# Patient Record
Sex: Female | Born: 1980
Health system: Southern US, Community
[De-identification: ages and names within clinical notes are randomized; demographics above are authoritative.]

## PROBLEM LIST (undated history)

## (undated) DIAGNOSIS — N6019 Diffuse cystic mastopathy of unspecified breast: Secondary | ICD-10-CM

## (undated) DIAGNOSIS — M222X1 Patellofemoral disorders, right knee: Secondary | ICD-10-CM

## (undated) DIAGNOSIS — N809 Endometriosis, unspecified: Secondary | ICD-10-CM

## (undated) DIAGNOSIS — N951 Menopausal and female climacteric states: Secondary | ICD-10-CM

## (undated) DIAGNOSIS — N63 Unspecified lump in unspecified breast: Secondary | ICD-10-CM

## (undated) DIAGNOSIS — D241 Benign neoplasm of right breast: Secondary | ICD-10-CM

## (undated) DIAGNOSIS — Z9889 Other specified postprocedural states: Secondary | ICD-10-CM

## (undated) DIAGNOSIS — J309 Allergic rhinitis, unspecified: Secondary | ICD-10-CM

## (undated) DIAGNOSIS — Z8619 Personal history of other infectious and parasitic diseases: Secondary | ICD-10-CM

## (undated) DIAGNOSIS — K219 Gastro-esophageal reflux disease without esophagitis: Secondary | ICD-10-CM

## (undated) DIAGNOSIS — R002 Palpitations: Secondary | ICD-10-CM

## (undated) HISTORY — DX: Diffuse cystic mastopathy of unspecified breast: N60.19

## (undated) HISTORY — DX: Benign neoplasm of right breast: D24.1

## (undated) HISTORY — DX: Allergic rhinitis, unspecified: J30.9

## (undated) HISTORY — DX: Personal history of other infectious and parasitic diseases: Z86.19

## (undated) HISTORY — DX: Endometriosis, unspecified: N80.9

---
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
## 2008-05-26 ENCOUNTER — Ambulatory Visit: Payer: Self-pay | Admitting: Obstetrics and Gynecology

## 2008-09-15 ENCOUNTER — Observation Stay: Payer: Self-pay | Admitting: Obstetrics and Gynecology

## 2008-09-16 ENCOUNTER — Ambulatory Visit: Payer: Self-pay

## 2008-11-02 ENCOUNTER — Inpatient Hospital Stay: Payer: Self-pay

## 2011-02-11 ENCOUNTER — Inpatient Hospital Stay: Payer: Self-pay

## 2013-05-25 ENCOUNTER — Encounter: Payer: Self-pay | Admitting: Family Medicine

## 2013-05-25 ENCOUNTER — Ambulatory Visit (INDEPENDENT_AMBULATORY_CARE_PROVIDER_SITE_OTHER): Payer: BC Managed Care – PPO | Admitting: Family Medicine

## 2013-05-25 VITALS — BP 110/72 | HR 72 | Temp 98.6°F | Ht 67.0 in | Wt 139.8 lb

## 2013-05-25 DIAGNOSIS — N390 Urinary tract infection, site not specified: Secondary | ICD-10-CM | POA: Insufficient documentation

## 2013-05-25 DIAGNOSIS — N809 Endometriosis, unspecified: Secondary | ICD-10-CM

## 2013-05-25 DIAGNOSIS — R5381 Other malaise: Secondary | ICD-10-CM

## 2013-05-25 DIAGNOSIS — R5383 Other fatigue: Secondary | ICD-10-CM

## 2013-05-25 DIAGNOSIS — J309 Allergic rhinitis, unspecified: Secondary | ICD-10-CM

## 2013-05-25 DIAGNOSIS — Z23 Encounter for immunization: Secondary | ICD-10-CM

## 2013-05-25 LAB — CBC WITH DIFFERENTIAL/PLATELET
Basophils Absolute: 0 10*3/uL (ref 0.0–0.1)
Basophils Relative: 0.6 % (ref 0.0–3.0)
Eosinophils Absolute: 0.2 10*3/uL (ref 0.0–0.7)
HCT: 40.1 % (ref 36.0–46.0)
Hemoglobin: 13.7 g/dL (ref 12.0–15.0)
Lymphs Abs: 2.7 10*3/uL (ref 0.7–4.0)
MCHC: 34.1 g/dL (ref 30.0–36.0)
MCV: 86.3 fl (ref 78.0–100.0)
Monocytes Relative: 5.4 % (ref 3.0–12.0)
Neutro Abs: 3.2 10*3/uL (ref 1.4–7.7)
Platelets: 264 10*3/uL (ref 150.0–400.0)
RBC: 4.65 Mil/uL (ref 3.87–5.11)
RDW: 12.8 % (ref 11.5–14.6)
WBC: 6.4 10*3/uL (ref 4.5–10.5)

## 2013-05-25 NOTE — Patient Instructions (Signed)
Let's check blood work today - blood count. Good to meet you today, call us with questions. Tdap today. Return as needed or in next few years for a physical.

## 2013-05-25 NOTE — Assessment & Plan Note (Signed)
Recent infection - treated with 3d course cipro which led to GI upset. Mild persistent sxs of bladder pressure, but overall improved. Advised continue to monitor - and will notify me if has persistent sxs.  she states she is expecting a f/u call from GYN and will check with them regarding sensitivity of UCx to cipro.

## 2013-05-25 NOTE — Addendum Note (Signed)
Addended by: Josph Macho A on: 05/25/2013 11:47 AM   Modules accepted: Orders

## 2013-05-25 NOTE — Progress Notes (Signed)
Subjective:    Patient ID: Marie Lester, female    DOB: [birth year], 32 y.o.   MRN: [id number]  HPI CC: new pt to establish  [name] presents today to establish care .  She moved here about 6 yrs ago from [place].  She is married to Cruz Condon a Hydrologist.  Recent UTI with hematuria and frequency and urgency and suprapubic and periumbilical discomfort - seen at [facility] and treatdd with cipro 3d course.  Finished med on Sunday.  Treated with cipro and pyridium - this caused GI distress after food with nausea.  Total of 3d course.  Some persistent urine symptoms of pressure.  Some chills and night sweats.  No back pain.  Allergic rhinitis - waking up at night with allergy sxs - was on singulair for this which helped.  Recently had allergy skin testing - allergic to dustmites and ragweed.  Has stopped med because sxs improved.  Also took zyrtec for this.  Also has used nasonex prn.  Sees Dr. Andee Poles for this [facility]).  Endometriosis stage 3 - followed by OBGYN.  Had surgery at age 15 yo.  May need rpt surgery.  Did have recent prolonged bleed with new birth control, that has resolved now.  Has felt weak and tired, but no dizziness, chest pain, palpitations.  She recently had normal thyroid function.  H/o neuralgia of right side of face last year but now better.  Preventative: Last year normal blood work (chol, sugar) per patient. Dr. Vear Clock OBGYN last well woman was about 2-3 months ago. Flu shot - declines today. Tetanus - >10 yrs ago.  Would like today.  Lives with husband and 4 children, 1 dog Occupation: stay at home mom Edu: AA Activity: runs regularly, works out Diet: good water, fruits/vegetables daily, fish  Medications and allergies reviewed and updated in chart.  Past histories reviewed and updated if relevant as below. There are no active problems to display for this patient.  Past Medical History  Diagnosis Date  . History of chicken pox   .  Endometriosis 2001    stage 3 s/p surgery at age 74 yo  . Fibroadenoma of right breast in female 2000    removed  . Allergic rhinitis   . Fibrocystic breast    Past Surgical History  Procedure Laterality Date  . Laparoscopy  2001    for endometriosis  . Tumor removal Right 2000    fibroadenoma-right breast   History  Substance Use Topics  . Smoking status: Never Smoker   . Smokeless tobacco: Never Used  . Alcohol Use: No   Family History  Problem Relation Age of Onset  . Alcohol abuse Maternal Grandfather     and grandmother  . CAD Neg Hx   . Stroke Neg Hx   . Diabetes Neg Hx   . Cancer Neg Hx    Allergies  Allergen Reactions  . Augmentin [Amoxicillin-Pot Clavulanate] Other (See Comments)    "feels really bad on it"  . Ciprofloxacin Nausea Only    Gi upset  . Codeine Nausea And Vomiting   No current outpatient prescriptions on file prior to visit.   No current facility-administered medications on file prior to visit.     Review of Systems Per HPI    Objective:   Physical Exam  Nursing note and vitals reviewed. Constitutional: She is oriented to person, place, and time. She appears well-developed and well-nourished. No distress.  HENT:  Head: Normocephalic and  atraumatic.  Right Ear: Hearing, tympanic membrane, external ear and ear canal normal.  Left Ear: Hearing, tympanic membrane, external ear and ear canal normal.  Nose: Nose normal.  Mouth/Throat: Oropharynx is clear and moist. No oropharyngeal exudate.  Eyes: Conjunctivae and EOM are normal. Pupils are equal, round, and reactive to light. No scleral icterus.  Neck: Normal range of motion. Neck supple. No thyromegaly present.  Cardiovascular: Normal rate, regular rhythm, normal heart sounds and intact distal pulses.   No murmur heard. Pulses:      Radial pulses are 2+ on the right side, and 2+ on the left side.  Slight 1/6 flow murmur  Pulmonary/Chest: Effort normal and breath sounds normal. No  respiratory distress. She has no wheezes. She has no rales.  Abdominal: Soft. Bowel sounds are normal. She exhibits no distension and no mass. There is no tenderness. There is no rebound and no guarding.  Musculoskeletal: Normal range of motion. She exhibits no edema.  Lymphadenopathy:    She has no cervical adenopathy.  Neurological: She is alert and oriented to person, place, and time.  CN grossly intact, station and gait intact  Skin: Skin is warm and dry. No rash noted. No pallor.  Psychiatric: She has a normal mood and affect. Her behavior is normal. Judgment and thought content normal.       Assessment & Plan:

## 2013-05-25 NOTE — Progress Notes (Signed)
Pre-visit discussion using our clinic review tool. No additional management support is needed unless otherwise documented below in the visit note.  

## 2013-05-25 NOTE — Assessment & Plan Note (Signed)
Will continue to follow with GYN

## 2013-05-25 NOTE — Assessment & Plan Note (Signed)
She has not been resting as well recently, and does care for 4 children at home.  Will check CBC in setting of recent endorsed menorrhagia to ensure not another cause for fatigue.  Pt states recent normal thyroid level.

## 2013-05-25 NOTE — Assessment & Plan Note (Signed)
Currently stable.

## 2013-06-21 ENCOUNTER — Encounter: Payer: Self-pay | Admitting: Internal Medicine

## 2013-06-21 ENCOUNTER — Ambulatory Visit (INDEPENDENT_AMBULATORY_CARE_PROVIDER_SITE_OTHER): Payer: BC Managed Care – PPO | Admitting: Internal Medicine

## 2013-06-21 ENCOUNTER — Telehealth: Payer: Self-pay

## 2013-06-21 VITALS — BP 106/68 | HR 67 | Temp 97.7°F | Wt 141.8 lb

## 2013-06-21 DIAGNOSIS — J019 Acute sinusitis, unspecified: Secondary | ICD-10-CM

## 2013-06-21 MED ORDER — CEFUROXIME AXETIL 500 MG PO TABS: 500.0000 mg | ORAL_TABLET | Freq: Two times a day (BID) | ORAL | Status: DC

## 2013-06-21 MED ORDER — FLUTICASONE PROPIONATE 50 MCG/ACT NA SUSP: 2.0000 | Freq: Every day | NASAL | Status: DC

## 2013-06-21 NOTE — Patient Instructions (Signed)

## 2013-06-21 NOTE — Telephone Encounter (Signed)
Pt request less expensive antibiotic; cost to pt for ceftin is $69.00. Pt said she is allergic to Augmentin and will be OK if antibiotic is not sent in today. Pt request cb when new antibiotic sent to [facility].

## 2013-06-21 NOTE — Progress Notes (Signed)
Pre-visit discussion using our clinic review tool. No additional management support is needed unless otherwise documented below in the visit note.  

## 2013-06-21 NOTE — Progress Notes (Signed)
HPI  Pt presents to the clinic today with c/o cough and facial pain. This started about 1 week ago. The cough is productive of thick yellow mucous. She is blowing thick green "chunky sputum" out of her nose. She has taken Tylenol cold and sinus ibuprofen, mucinex and Nyquil without much relief. She does have a history of allergies. She does not take an anthihistamine OTC daily. She has not had sick contacts that she is aware of.  Review of Systems      Past Medical History  Diagnosis Date  . History of chicken pox   . Endometriosis 2001    stage 3 s/p surgery at age 68 yo  . Fibroadenoma of right breast in female 2000    removed  . Allergic rhinitis     dustmite, ragweed  . Fibrocystic breast     Family History  Problem Relation Age of Onset  . Alcohol abuse Maternal Grandfather     and grandmother  . CAD Neg Hx   . Stroke Neg Hx   . Diabetes Neg Hx   . Cancer Neg Hx     History   Social History  . Marital Status: Married    Spouse Name: N/A    Number of Children: N/A  . Years of Education: N/A   Occupational History  . Not on file.   Social History Main Topics  . Smoking status: Never Smoker   . Smokeless tobacco: Never Used  . Alcohol Use: No  . Drug Use: No  . Sexual Activity: Not on file   Other Topics Concern  . Not on file   Social History Narrative   Lives with husband and 4 children, 1 dog   Occupation: stay at home mom   Edu: AA   Activity: runs regularly, works out   Diet: good water, fruits/vegetables daily, fish    Allergies  Allergen Reactions  . Augmentin [Amoxicillin-Pot Clavulanate] Other (See Comments)    "feels really bad on it"  . Ciprofloxacin Nausea Only    Gi upset  . Codeine Nausea And Vomiting     Constitutional: Positive headache, fatigue and fever. Denies abrupt weight changes.  HEENT:  Positive nasal congestion and sore throat. Denies eye redness, eye pain, pressure behind the eyes, facial pain, ear pain, ringing in the  ears, wax buildup, runny nose or bloody nose. Respiratory: Positive cough. Denies difficulty breathing or shortness of breath.  Cardiovascular: Denies chest pain, chest tightness, palpitations or swelling in the hands or feet.   No other specific complaints in a complete review of systems (except as listed in HPI above).  Objective:   BP 106/68  Pulse 67  Temp(Src) 97.7 F (36.5 C) (Oral)  Wt 141 lb 12.8 oz (64.32 kg)  SpO2 98%  LMP 05/15/2013 Wt Readings from Last 3 Encounters:  06/21/13 141 lb 12.8 oz (64.32 kg)  05/25/13 139 lb 12 oz (63.39 kg)     General: Appears her stated age, well developed, well nourished in NAD. HEENT: Head: normal shape and size, sinus tenderness noted; Eyes: sclera white, no icterus, conjunctiva pink, PERRLA and EOMs intact; Ears: Tm's gray and intact, normal light reflex; Nose: mucosa pink and moist, septum midline; Throat/Mouth: + PND. Teeth present, mucosa erythematous and moist, no exudate noted, no lesions or ulcerations noted.  Neck: Mild cervical lymphadenopathy. Neck supple, trachea midline. No massses, lumps or thyromegaly present.  Cardiovascular: Normal rate and rhythm. S1,S2 noted.  No murmur, rubs or gallops noted. No JVD  or BLE edema. No carotid bruits noted. Pulmonary/Chest: Normal effort and positive vesicular breath sounds. No respiratory distress. No wheezes, rales or ronchi noted.      Assessment & Plan:   Acute Sinusitis:  Get some rest and drink plenty of water Do salt water gargles for the sore throat eRx for ceftin BID x 10 days and flonase  RTC as needed or if symptoms persist.

## 2013-06-22 ENCOUNTER — Other Ambulatory Visit: Payer: Self-pay | Admitting: Internal Medicine

## 2013-06-22 MED ORDER — CEFDINIR 300 MG PO CAPS: 300.0000 mg | ORAL_CAPSULE | Freq: Two times a day (BID) | ORAL | Status: DC

## 2013-06-22 NOTE — Telephone Encounter (Signed)
Sent in omnicef

## 2013-06-22 NOTE — Telephone Encounter (Signed)
Left detailed message letting pt know the new Rx was sent to pharmacy

## 2013-08-02 ENCOUNTER — Ambulatory Visit: Payer: BC Managed Care – PPO | Admitting: Family Medicine

## 2013-08-05 ENCOUNTER — Ambulatory Visit: Payer: BC Managed Care – PPO | Admitting: Family Medicine

## 2013-08-09 ENCOUNTER — Ambulatory Visit: Payer: BC Managed Care – PPO | Admitting: Family Medicine

## 2013-08-12 ENCOUNTER — Encounter: Payer: Self-pay | Admitting: Family Medicine

## 2013-08-12 ENCOUNTER — Ambulatory Visit (INDEPENDENT_AMBULATORY_CARE_PROVIDER_SITE_OTHER): Payer: BC Managed Care – PPO | Admitting: Family Medicine

## 2013-08-12 VITALS — BP 108/74 | HR 69 | Temp 98.7°F | Ht 67.0 in | Wt 139.5 lb

## 2013-08-12 DIAGNOSIS — K2990 Gastroduodenitis, unspecified, without bleeding: Secondary | ICD-10-CM

## 2013-08-12 DIAGNOSIS — K2970 Gastritis, unspecified, without bleeding: Secondary | ICD-10-CM | POA: Insufficient documentation

## 2013-08-12 DIAGNOSIS — K219 Gastro-esophageal reflux disease without esophagitis: Secondary | ICD-10-CM | POA: Insufficient documentation

## 2013-08-12 NOTE — Progress Notes (Signed)
   Subjective:    Patient ID: Marie Lester, female    DOB: [birth year], 33 y.o.   MRN: [id number]  HPI  33 year old female pt of Dr. Leo Grosser presents with new onset in of diarrhea. Started 3 weeks ago diarrhea at night, resolved after 3 days. After this she started with epigastric pain, belching, GERD, reflux.  Occ wheeze with breathing in at night.  She had recently started on prilosec 40 mg x 2 weeks, zantac as well as recommended from her previous MD, friend of family.  No fever.  She compeleted prilosec yesterday. She has had less burping, still nausea. Some occ intermittant pain behind umbilicus.   She also started probiotic. BMs more regular now.  Review of Systems  Constitutional: Negative for fever and fatigue.  HENT: Negative for ear pain.   Eyes: Negative for pain.  Respiratory: Negative for chest tightness and shortness of breath.   Cardiovascular: Negative for chest pain, palpitations and leg swelling.  Gastrointestinal: Positive for abdominal pain.  Genitourinary: Negative for dysuria.       Objective:   Physical Exam  Constitutional: Vital signs are normal. She appears well-developed and well-nourished. She is cooperative.  Non-toxic appearance. She does not appear ill. No distress.  HENT:  Head: Normocephalic.  Right Ear: Hearing, tympanic membrane, external ear and ear canal normal. Tympanic membrane is not erythematous, not retracted and not bulging.  Left Ear: Hearing, tympanic membrane, external ear and ear canal normal. Tympanic membrane is not erythematous, not retracted and not bulging.  Nose: No mucosal edema or rhinorrhea. Right sinus exhibits no maxillary sinus tenderness and no frontal sinus tenderness. Left sinus exhibits no maxillary sinus tenderness and no frontal sinus tenderness.  Mouth/Throat: Uvula is midline, oropharynx is clear and moist and mucous membranes are normal.  Eyes: Conjunctivae, EOM and lids are normal. Pupils are equal, round, and  reactive to light. Lids are everted and swept, no foreign bodies found.  Neck: Trachea normal and normal range of motion. Neck supple. Carotid bruit is not present. No mass and no thyromegaly present.  Cardiovascular: Normal rate, regular rhythm, S1 normal, S2 normal, normal heart sounds, intact distal pulses and normal pulses.  Exam reveals no gallop and no friction rub.   No murmur heard. Pulmonary/Chest: Effort normal and breath sounds normal. Not tachypneic. No respiratory distress. She has no decreased breath sounds. She has no wheezes. She has no rhonchi. She has no rales.  Abdominal: Soft. Normal appearance and bowel sounds are normal. There is no hepatosplenomegaly. There is tenderness in the periumbilical area. There is no rigidity, no rebound, no guarding and no CVA tenderness.  She does have separation of abdominal wall muscles from pregnancies but no palpated hernia  Neurological: She is alert.  Skin: Skin is warm, dry and intact. No rash noted.  Psychiatric: Her speech is normal and behavior is normal. Judgment and thought content normal. Her mood appears not anxious. Cognition and memory are normal. She does not exhibit a depressed mood.          Assessment & Plan:  GERD, gastritis. No sign of hernia, appendicitis or gallbaldder issue.  PUD possible Treat with change to nexium for 4 more week. If not continuing to improve after 2 weeks, call for GI referral  Once done course wean off.  Avoid triggers.

## 2013-08-12 NOTE — Progress Notes (Signed)
Pre visit review using our clinic review tool, if applicable. No additional management support is needed unless otherwise documented below in the visit note. 

## 2013-08-12 NOTE — Patient Instructions (Signed)
Stop prilosec. Start nexium 40 mg daily for 4-6 weeks. If not improving further in 2 week , call for GI referral. Continue to avoid citris, tomatos, caffeine, alcohol, soda, chocolate, spicy. Can continue probiotic if you like.

## 2013-08-15 ENCOUNTER — Ambulatory Visit: Payer: BC Managed Care – PPO | Admitting: Family Medicine

## 2014-04-06 ENCOUNTER — Encounter: Payer: Self-pay | Admitting: Internal Medicine

## 2014-04-06 ENCOUNTER — Ambulatory Visit (INDEPENDENT_AMBULATORY_CARE_PROVIDER_SITE_OTHER): Payer: BC Managed Care – PPO | Admitting: Internal Medicine

## 2014-04-06 VITALS — BP 104/72 | HR 61 | Temp 98.1°F | Wt 143.5 lb

## 2014-04-06 DIAGNOSIS — L255 Unspecified contact dermatitis due to plants, except food: Secondary | ICD-10-CM

## 2014-04-06 MED ORDER — METHYLPREDNISOLONE ACETATE 80 MG/ML IJ SUSP
80.0000 mg | Freq: Once | INTRAMUSCULAR | Status: AC
  Administered 2014-04-06: 80 mg via INTRAMUSCULAR

## 2014-04-06 NOTE — Progress Notes (Signed)
Pre visit review using our clinic review tool, if applicable. No additional management support is needed unless otherwise documented below in the visit note. 

## 2014-04-06 NOTE — Patient Instructions (Addendum)

## 2014-04-07 NOTE — Progress Notes (Signed)
Subjective:    Patient ID: Marie Lester, female    DOB: 1981/05/14, 33 y.o.   MRN: [id number]  HPI  Pt presents to the clinic today with c/o a rash on her neck and arms. She noticed this 3 days ago after pulling weeds out in her yard. The rash is very itchy and burning. She has put mometasone cream on it without any relief. Her kid also has poison ivy and is on steroids. She has not changed lotion, soaps or detergents.  Review of Systems      Past Medical History  Diagnosis Date  . History of chicken pox   . Endometriosis 2001    stage 3 s/p surgery at age 31 yo  . Fibroadenoma of right breast in female 2000    removed  . Allergic rhinitis     dustmite, ragweed  . Fibrocystic breast     Current Outpatient Prescriptions  Medication Sig Dispense Refill  . b complex vitamins tablet Take 1 tablet by mouth daily.      . Cholecalciferol (VITAMIN D-3) 5000 UNITS TABS Take 1 tablet by mouth daily.       No current facility-administered medications for this visit.    Allergies  Allergen Reactions  . Augmentin [Amoxicillin-Pot Clavulanate] Other (See Comments)    "feels really bad on it"  . Ciprofloxacin Nausea Only    Gi upset  . Codeine Nausea And Vomiting    Family History  Problem Relation Age of Onset  . Alcohol abuse Maternal Grandfather     and grandmother  . CAD Neg Hx   . Stroke Neg Hx   . Diabetes Neg Hx   . Cancer Neg Hx     History   Social History  . Marital Status: Married    Spouse Name: N/A    Number of Children: N/A  . Years of Education: N/A   Occupational History  . Not on file.   Social History Main Topics  . Smoking status: Never Smoker   . Smokeless tobacco: Never Used  . Alcohol Use: No  . Drug Use: No  . Sexual Activity: Not on file   Other Topics Concern  . Not on file   Social History Narrative   Lives with husband and 4 children, 1 dog   Occupation: stay at home mom   Edu: AA   Activity: runs regularly, works out   Diet:  good water, fruits/vegetables daily, fish     Constitutional: Denies fever, malaise, fatigue, headache or abrupt weight changes.  Skin: Pt reports rash. Denies ulcercations.    No other specific complaints in a complete review of systems (except as listed in HPI above).  Objective:   Physical Exam  BP 104/72  Pulse 61  Temp(Src) 98.1 F (36.7 C) (Oral)  Wt 143 lb 8 oz (65.091 kg)  SpO2 99% Wt Readings from Last 3 Encounters:  04/06/14 143 lb 8 oz (65.091 kg)  08/12/13 139 lb 8 oz (63.277 kg)  06/21/13 141 lb 12.8 oz (64.32 kg)    General: Appears her stated age, well developed, well nourished in NAD. Skin: Vesicular rash on erythematous base noted on bilateral arms and neck. Excoriation noted from scratching. Cardiovascular: Normal rate and rhythm. S1,S2 noted.  No murmur, rubs or gallops noted.  Pulmonary/Chest: Normal effort and positive vesicular breath sounds. No respiratory distress. No wheezes, rales or ronchi noted.   CBC    Component Value Date/Time   WBC 6.4 05/25/2013 1125  RBC 4.65 05/25/2013 1125   HGB 13.7 05/25/2013 1125   HCT 40.1 05/25/2013 1125   PLT 264.0 05/25/2013 1125   MCV 86.3 05/25/2013 1125   MCHC 34.1 05/25/2013 1125   RDW 12.8 05/25/2013 1125   LYMPHSABS 2.7 05/25/2013 1125   MONOABS 0.3 05/25/2013 1125   EOSABS 0.2 05/25/2013 1125   BASOSABS 0.0 05/25/2013 1125    Hgb A1C No results found for this basename: HGBA1C         Assessment & Plan:   Contact dermatitis due to plant:  80 mg Depo IM today Benadryl as needed for itching  RTC as needed or if symptoms persist or worsen

## 2014-04-10 ENCOUNTER — Encounter: Payer: Self-pay | Admitting: Internal Medicine

## 2014-04-10 ENCOUNTER — Ambulatory Visit (INDEPENDENT_AMBULATORY_CARE_PROVIDER_SITE_OTHER): Payer: BC Managed Care – PPO | Admitting: Internal Medicine

## 2014-04-10 VITALS — BP 112/68 | HR 58 | Temp 98.8°F | Wt 143.0 lb

## 2014-04-10 DIAGNOSIS — L259 Unspecified contact dermatitis, unspecified cause: Secondary | ICD-10-CM

## 2014-04-10 DIAGNOSIS — L237 Allergic contact dermatitis due to plants, except food: Secondary | ICD-10-CM

## 2014-04-10 MED ORDER — TRIAMCINOLONE ACETONIDE 0.1 % EX CREA: 1.0000 "application " | TOPICAL_CREAM | Freq: Two times a day (BID) | CUTANEOUS | Status: DC

## 2014-04-10 MED ORDER — PREDNISONE 10 MG PO TABS: ORAL_TABLET | ORAL | Status: DC

## 2014-04-10 MED ORDER — DEXAMETHASONE SODIUM PHOSPHATE 10 MG/ML IJ SOLN
10.0000 mg | Freq: Once | INTRAMUSCULAR | Status: AC
  Administered 2014-04-10: 10 mg via INTRAMUSCULAR

## 2014-04-10 NOTE — Progress Notes (Signed)
Subjective:    Patient ID: Marie Lester, female    DOB: 1980-07-06, 33 y.o.   MRN: [id number]  HPI  Pt presents to the clinic today to follow up poison ivy. She was seen 10/29 for the same. She was given 80 mg Depo IM. She reports that the shot helped with the itching but the rash has spread and gotten worse. She reports that she is highly allergic to poison ivy. She also tried some mometasone cream without much relief.  Review of Systems      Past Medical History  Diagnosis Date  . History of chicken pox   . Endometriosis 2001    stage 3 s/p surgery at age 62 yo  . Fibroadenoma of right breast in female 2000    removed  . Allergic rhinitis     dustmite, ragweed  . Fibrocystic breast     Current Outpatient Prescriptions  Medication Sig Dispense Refill  . b complex vitamins tablet Take 1 tablet by mouth daily.    . Cholecalciferol (VITAMIN D-3) 5000 UNITS TABS Take 1 tablet by mouth daily.    Marland Kitchen triamcinolone cream (KENALOG) 0.1 % Apply 1 application topically 2 (two) times daily. 30 g 0   No current facility-administered medications for this visit.    Allergies  Allergen Reactions  . Augmentin [Amoxicillin-Pot Clavulanate] Other (See Comments)    "feels really bad on it"  . Ciprofloxacin Nausea Only    Gi upset  . Codeine Nausea And Vomiting    Family History  Problem Relation Age of Onset  . Alcohol abuse Maternal Grandfather     and grandmother  . CAD Neg Hx   . Stroke Neg Hx   . Diabetes Neg Hx   . Cancer Neg Hx     History   Social History  . Marital Status: Married    Spouse Name: N/A    Number of Children: N/A  . Years of Education: N/A   Occupational History  . Not on file.   Social History Main Topics  . Smoking status: Never Smoker   . Smokeless tobacco: Never Used  . Alcohol Use: No  . Drug Use: No  . Sexual Activity: Not on file   Other Topics Concern  . Not on file   Social History Narrative   Lives with husband and 4 children, 1  dog   Occupation: stay at home mom   Edu: AA   Activity: runs regularly, works out   Diet: good water, fruits/vegetables daily, fish     Constitutional: Denies fever, malaise, fatigue, headache or abrupt weight changes.  Respiratory: Denies difficulty breathing, shortness of breath, cough or sputum production.   Cardiovascular: Denies chest pain, chest tightness, palpitations or swelling in the hands or feet.  Skin: Pt reports rash. Denies ulcercations.     No other specific complaints in a complete review of systems (except as listed in HPI above).  Objective:   Physical Exam  BP 112/68 mmHg  Pulse 58  Temp(Src) 98.8 F (37.1 C) (Oral)  Wt 143 lb (64.864 kg)  SpO2 98% Wt Readings from Last 3 Encounters:  04/10/14 143 lb (64.864 kg)  04/06/14 143 lb 8 oz (65.091 kg)  08/12/13 139 lb 8 oz (63.277 kg)    General: Appears her stated age, well developed, well nourished in NAD. Skin: Scattered vesicular lesions in linear pattern. Some lesions scabbed over. Excoriation noted. Cardiovascular: Normal rate and rhythm. S1,S2 noted.  No murmur, rubs or gallops noted.  No JVD or BLE edema. No carotid bruits noted. Pulmonary/Chest: Normal effort and positive vesicular breath sounds. No respiratory distress. No wheezes, rales or ronchi noted.   BMET No results found for: NA, K, CL, CO2, GLUCOSE, BUN, CREATININE, CALCIUM, GFRNONAA, GFRAA  Lipid Panel  No results found for: CHOL, TRIG, HDL, CHOLHDL, VLDL, LDLCALC  CBC    Component Value Date/Time   WBC 6.4 05/25/2013 1125   RBC 4.65 05/25/2013 1125   HGB 13.7 05/25/2013 1125   HCT 40.1 05/25/2013 1125   PLT 264.0 05/25/2013 1125   MCV 86.3 05/25/2013 1125   MCHC 34.1 05/25/2013 1125   RDW 12.8 05/25/2013 1125   LYMPHSABS 2.7 05/25/2013 1125   MONOABS 0.3 05/25/2013 1125   EOSABS 0.2 05/25/2013 1125   BASOSABS 0.0 05/25/2013 1125    Hgb A1C No results found for: HGBA1C       Assessment & Plan:   Contact dermatitis due  to plant:  Did not resolve with 80 mg Depo She does not want to try oral steroids Will try 10 mg Decadron today eRx for triamcinolone cream  RTC as needed or if symptoms persist

## 2014-04-10 NOTE — Patient Instructions (Signed)

## 2014-04-10 NOTE — Progress Notes (Signed)
Pre visit review using our clinic review tool, if applicable. No additional management support is needed unless otherwise documented below in the visit note. 

## 2015-04-10 HISTORY — PX: AUGMENTATION MAMMAPLASTY: SUR837

## 2015-07-02 ENCOUNTER — Other Ambulatory Visit: Payer: Self-pay | Admitting: Obstetrics and Gynecology

## 2015-07-02 DIAGNOSIS — N63 Unspecified lump in unspecified breast: Secondary | ICD-10-CM

## 2015-07-13 ENCOUNTER — Ambulatory Visit
Admission: RE | Admit: 2015-07-13 | Discharge: 2015-07-13 | Disposition: A | Discharge status: Home / Self Care | Payer: BLUE CROSS/BLUE SHIELD | Source: Ambulatory Visit | Attending: Obstetrics and Gynecology | Admitting: Obstetrics and Gynecology

## 2015-07-13 ENCOUNTER — Encounter: Payer: Self-pay | Admitting: Family Medicine

## 2015-07-13 ENCOUNTER — Other Ambulatory Visit: Payer: Self-pay | Admitting: Obstetrics and Gynecology

## 2015-07-13 ENCOUNTER — Ambulatory Visit (INDEPENDENT_AMBULATORY_CARE_PROVIDER_SITE_OTHER): Payer: BLUE CROSS/BLUE SHIELD | Admitting: Family Medicine

## 2015-07-13 VITALS — BP 108/64 | HR 71 | Temp 98.9°F | Ht 67.0 in | Wt 145.2 lb

## 2015-07-13 DIAGNOSIS — J0100 Acute maxillary sinusitis, unspecified: Secondary | ICD-10-CM

## 2015-07-13 DIAGNOSIS — N6002 Solitary cyst of left breast: Secondary | ICD-10-CM | POA: Diagnosis not present

## 2015-07-13 DIAGNOSIS — N63 Unspecified lump in unspecified breast: Secondary | ICD-10-CM

## 2015-07-13 DIAGNOSIS — J019 Acute sinusitis, unspecified: Secondary | ICD-10-CM | POA: Insufficient documentation

## 2015-07-13 HISTORY — DX: Unspecified lump in unspecified breast: N63.0

## 2015-07-13 MED ORDER — DOXYCYCLINE HYCLATE 100 MG PO TABS: 100.0000 mg | ORAL_TABLET | Freq: Two times a day (BID) | ORAL | Status: DC

## 2015-07-13 NOTE — Assessment & Plan Note (Addendum)
Symptoms consistent with sinusitis. We'll treat with doxycycline given her Augmentin allergy. She is currently on her period and notes her husband has a vasectomy. Advised against getting pregnant while on doxycycline. She is to take a probiotic while on this antibiotic. Start Claritin and Flonase as well. Given return precautions.

## 2015-07-13 NOTE — Progress Notes (Signed)
Patient ID: Sokhna Onion, female   DOB: August 21, 1980, 35 y.o.   MRN: [id number]  Tommi Rumps, MD Phone: [phone]  Mersadie Higashi is a 35 y.o. female who presents today for same-day visit.  Patient notes 3 weeks of sinus and nasal congestion. Initially she had some headache with this though this resolved. She notes postnasal drip. She notes pressure in her maxillary and frontal sinuses. She has been coughing up a mild amount of yellow mucus. No shortness of breath. No fevers. Her ears have pressure. No sick contacts. There has been no improvement over the last 3 weeks.  PMH: nonsmoker.   ROS see history of present illness  Objective  Physical Exam Filed Vitals:   07/13/15 1459  BP: 108/64  Pulse: 71  Temp: 98.9 F (37.2 C)   Physical Exam  Constitutional: She is well-developed, well-nourished, and in no distress.  HENT:  Head: Normocephalic and atraumatic.  Right Ear: External ear normal.  Left Ear: External ear normal.  Mild posterior oropharyngeal erythema, no exudates or tonsillar swelling, normal TMs bilaterally  Eyes: Conjunctivae are normal. Pupils are equal, round, and reactive to light.  Neck: Neck supple.  Cardiovascular: Normal rate, regular rhythm and normal heart sounds.  Exam reveals no gallop and no friction rub.   No murmur heard. Pulmonary/Chest: Effort normal. No respiratory distress. She has no wheezes. She has no rales.  Lymphadenopathy:    She has no cervical adenopathy.  Neurological: She is alert. Gait normal.  Skin: Skin is warm and dry. She is not diaphoretic.     Assessment/Plan: Please see individual problem list.  Sinusitis Symptoms consistent with sinusitis. We'll treat with doxycycline given her Augmentin allergy. She is currently on her period and notes her husband has a vasectomy. Advised against getting pregnant while on doxycycline. She is to take a probiotic while on this antibiotic. Start Claritin and Flonase as well. Given return  precautions.    Meds ordered this encounter  Medications  . doxycycline (VIBRA-TABS) 100 MG tablet    Sig: Take 1 tablet (100 mg total) by mouth 2 (two) times daily.    Dispense:  14 tablet    Refill:  0    Tommi Rumps

## 2015-07-13 NOTE — Patient Instructions (Signed)
Nice to meet you. You likely have a sinus infection. We will treat this with doxycycline. You should also start Claritin and Flonase. If you develop shortness of breath, cough productive of blood, fevers, or new or changing symptoms please seek medical attention. You should take a probiotic while you're on the antibiotic.

## 2015-07-13 NOTE — Progress Notes (Signed)
Pre visit review using our clinic review tool, if applicable. No additional management support is needed unless otherwise documented below in the visit note. 

## 2016-05-09 LAB — HM PAP SMEAR: HM Pap smear: NORMAL

## 2016-06-12 ENCOUNTER — Ambulatory Visit (INDEPENDENT_AMBULATORY_CARE_PROVIDER_SITE_OTHER): Payer: BLUE CROSS/BLUE SHIELD | Admitting: Family Medicine

## 2016-06-12 ENCOUNTER — Encounter: Payer: Self-pay | Admitting: Family Medicine

## 2016-06-12 VITALS — BP 116/70 | HR 72 | Temp 99.0°F | Ht 68.0 in | Wt 144.5 lb

## 2016-06-12 DIAGNOSIS — Z0000 Encounter for general adult medical examination without abnormal findings: Secondary | ICD-10-CM | POA: Diagnosis not present

## 2016-06-12 NOTE — Progress Notes (Signed)
Pre visit review using our clinic review tool, if applicable. No additional management support is needed unless otherwise documented below in the visit note. 

## 2016-06-12 NOTE — Assessment & Plan Note (Signed)
Preventative protocols reviewed and updated unless pt declined. Discussed healthy diet and lifestyle.  

## 2016-06-12 NOTE — Progress Notes (Signed)
BP 116/70   Pulse 72   Temp 99 F (37.2 C) (Oral)   Ht 5\' 8"  (1.727 m)   Wt 144 lb 8 oz (65.5 kg)   LMP 06/02/2016   BMI 21.97 kg/m    CC: CPE Subjective:    Patient ID: Ilce Havins, female    DOB: January 05, 1981, 36 y.o.   MRN: [id number]  HPI: Yulma Postlethwaite is a 36 y.o. female presenting on 06/12/2016 for Annual Exam   Stays cold, heart flutters (once a week). She had recent normal thyroid check.   Preventative: Well woman - normal last month. Dr. Caffie Damme OBGYN. Labs through [facility].  Flu shot - declines today. Tdap 2014 Seat belt use discussed Sunscreen use discussed. No changing moles on skin. Non smoker Alcohol - none  Lives with husband and 4 children, 1 dog Occupation: stay at home mom Edu: AA Activity: runs regularly, works out Diet: good water, fruits/vegetables daily, fish in diet  Relevant past medical, surgical, family and social history reviewed and updated as indicated. Interim medical history since our last visit reviewed. Allergies and medications reviewed and updated. Current Outpatient Prescriptions on File Prior to Visit  Medication Sig  . Cholecalciferol (VITAMIN D-3) 5000 UNITS TABS Take 1 tablet by mouth daily.  Marland Kitchen b complex vitamins tablet Take 1 tablet by mouth daily.   No current facility-administered medications on file prior to visit.     Review of Systems  Constitutional: Negative for activity change, appetite change, chills, fatigue, fever and unexpected weight change.  HENT: Negative for hearing loss.   Eyes: Negative for visual disturbance.  Respiratory: Negative for cough, chest tightness, shortness of breath and wheezing.   Cardiovascular: Positive for palpitations. Negative for chest pain and leg swelling.  Gastrointestinal: Negative for abdominal distention, abdominal pain, blood in stool, constipation, diarrhea, nausea and vomiting.  Endocrine: Positive for cold intolerance.  Genitourinary: Negative for difficulty  urinating and hematuria.  Musculoskeletal: Negative for arthralgias, myalgias and neck pain.  Skin: Negative for rash.  Neurological: Negative for dizziness, seizures, syncope and headaches.  Hematological: Negative for adenopathy. Does not bruise/bleed easily.  Psychiatric/Behavioral: Negative for dysphoric mood. The patient is not nervous/anxious.    Per HPI unless specifically indicated in ROS section     Objective:    BP 116/70   Pulse 72   Temp 99 F (37.2 C) (Oral)   Ht 5\' 8"  (1.727 m)   Wt 144 lb 8 oz (65.5 kg)   LMP 06/02/2016   BMI 21.97 kg/m   Wt Readings from Last 3 Encounters:  06/12/16 144 lb 8 oz (65.5 kg)  07/13/15 145 lb 3.2 oz (65.9 kg)  04/10/14 143 lb (64.9 kg)    Physical Exam  Constitutional: She is oriented to person, place, and time. She appears well-developed and well-nourished. No distress.  HENT:  Head: Normocephalic and atraumatic.  Right Ear: Hearing, tympanic membrane, external ear and ear canal normal.  Left Ear: Hearing, tympanic membrane, external ear and ear canal normal.  Nose: Nose normal.  Mouth/Throat: Uvula is midline, oropharynx is clear and moist and mucous membranes are normal. No oropharyngeal exudate, posterior oropharyngeal edema or posterior oropharyngeal erythema.  Eyes: Conjunctivae and EOM are normal. Pupils are equal, round, and reactive to light. No scleral icterus.  Neck: Normal range of motion. Neck supple. No thyromegaly present.  Cardiovascular: Normal rate, regular rhythm, normal heart sounds and intact distal pulses.   No murmur heard. Pulses:      Radial pulses  are 2+ on the right side, and 2+ on the left side.  Pulmonary/Chest: Effort normal and breath sounds normal. No respiratory distress. She has no wheezes. She has no rales.  Abdominal: Soft. Bowel sounds are normal. She exhibits no distension and no mass. There is no tenderness. There is no rebound and no guarding.  Musculoskeletal: Normal range of motion. She  exhibits no edema.  Lymphadenopathy:    She has no cervical adenopathy.  Neurological: She is alert and oriented to person, place, and time.  CN grossly intact, station and gait intact  Skin: Skin is warm and dry. No rash noted.  Psychiatric: She has a normal mood and affect. Her behavior is normal. Judgment and thought content normal.  Nursing note and vitals reviewed.  Results for orders placed or performed in visit on 06/12/16  HM PAP SMEAR  Result Value Ref Range   HM Pap smear normal per patient       Assessment & Plan:   Problem List Items Addressed This Visit    Health maintenance examination - Primary    Preventative protocols reviewed and updated unless pt declined. Discussed healthy diet and lifestyle.           Follow up plan: Return in about 2 years (around 06/12/2018) for annual exam, prior fasting for blood work.  Ria Bush, MD

## 2016-06-12 NOTE — Patient Instructions (Addendum)
You are doing wonderfully today. Continue healthy diet and lifestyle. Return as needed or in 2-3 years for physical.   Health Maintenance, Female Introduction Adopting a healthy lifestyle and getting preventive care can go a long way to promote health and wellness. Talk with your health care provider about what schedule of regular examinations is right for you. This is a good chance for you to check in with your provider about disease prevention and staying healthy. In between checkups, there are plenty of things you can do on your own. Experts have done a lot of research about which lifestyle changes and preventive measures are most likely to keep you healthy. Ask your health care provider for more information. Weight and diet Eat a healthy diet  Be sure to include plenty of vegetables, fruits, low-fat dairy products, and lean protein.  Do not eat a lot of foods high in solid fats, added sugars, or salt.  Get regular exercise. This is one of the most important things you can do for your health.  Most adults should exercise for at least 150 minutes each week. The exercise should increase your heart rate and make you sweat (moderate-intensity exercise).  Most adults should also do strengthening exercises at least twice a week. This is in addition to the moderate-intensity exercise. Maintain a healthy weight  Body mass index (BMI) is a measurement that can be used to identify possible weight problems. It estimates body fat based on height and weight. Your health care provider can help determine your BMI and help you achieve or maintain a healthy weight.  For females 60 years of age and older:  A BMI below 18.5 is considered underweight.  A BMI of 18.5 to 24.9 is normal.  A BMI of 25 to 29.9 is considered overweight.  A BMI of 30 and above is considered obese. Watch levels of cholesterol and blood lipids  You should start having your blood tested for lipids and cholesterol at 36 years  of age, then have this test every 5 years.  You may need to have your cholesterol levels checked more often if:  Your lipid or cholesterol levels are high.  You are older than 36 years of age.  You are at high risk for heart disease. Cancer screening Lung Cancer  Lung cancer screening is recommended for adults 77-37 years old who are at high risk for lung cancer because of a history of smoking.  A yearly low-dose CT scan of the lungs is recommended for people who:  Currently smoke.  Have quit within the past 15 years.  Have at least a 30-pack-year history of smoking. A pack year is smoking an average of one pack of cigarettes a day for 1 year.  Yearly screening should continue until it has been 15 years since you quit.  Yearly screening should stop if you develop a health problem that would prevent you from having lung cancer treatment. Breast Cancer  Practice breast self-awareness. This means understanding how your breasts normally appear and feel.  It also means doing regular breast self-exams. Let your health care provider know about any changes, no matter how small.  If you are in your 20s or 30s, you should have a clinical breast exam (CBE) by a health care provider every 1-3 years as part of a regular health exam.  If you are 26 or older, have a CBE every year. Also consider having a breast X-ray (mammogram) every year.  If you have a family history of  breast cancer, talk to your health care provider about genetic screening.  If you are at high risk for breast cancer, talk to your health care provider about having an MRI and a mammogram every year.  Breast cancer gene (BRCA) assessment is recommended for women who have family members with BRCA-related cancers. BRCA-related cancers include:  Breast.  Ovarian.  Tubal.  Peritoneal cancers.  Results of the assessment will determine the need for genetic counseling and BRCA1 and BRCA2 testing. Cervical Cancer  Your  health care provider may recommend that you be screened regularly for cancer of the pelvic organs (ovaries, uterus, and vagina). This screening involves a pelvic examination, including checking for microscopic changes to the surface of your cervix (Pap test). You may be encouraged to have this screening done every 3 years, beginning at age 85.  For women ages 59-65, health care providers may recommend pelvic exams and Pap testing every 3 years, or they may recommend the Pap and pelvic exam, combined with testing for human papilloma virus (HPV), every 5 years. Some types of HPV increase your risk of cervical cancer. Testing for HPV may also be done on women of any age with unclear Pap test results.  Other health care providers may not recommend any screening for nonpregnant women who are considered low risk for pelvic cancer and who do not have symptoms. Ask your health care provider if a screening pelvic exam is right for you.  If you have had past treatment for cervical cancer or a condition that could lead to cancer, you need Pap tests and screening for cancer for at least 20 years after your treatment. If Pap tests have been discontinued, your risk factors (such as having a new sexual partner) need to be reassessed to determine if screening should resume. Some women have medical problems that increase the chance of getting cervical cancer. In these cases, your health care provider may recommend more frequent screening and Pap tests. Colorectal Cancer  This type of cancer can be detected and often prevented.  Routine colorectal cancer screening usually begins at 36 years of age and continues through 36 years of age.  Your health care provider may recommend screening at an earlier age if you have risk factors for colon cancer.  Your health care provider may also recommend using home test kits to check for hidden blood in the stool.  A small camera at the end of a tube can be used to examine your  colon directly (sigmoidoscopy or colonoscopy). This is done to check for the earliest forms of colorectal cancer.  Routine screening usually begins at age 6.  Direct examination of the colon should be repeated every 5-10 years through 36 years of age. However, you may need to be screened more often if early forms of precancerous polyps or small growths are found. Skin Cancer  Check your skin from head to toe regularly.  Tell your health care provider about any new moles or changes in moles, especially if there is a change in a mole's shape or color.  Also tell your health care provider if you have a mole that is larger than the size of a pencil eraser.  Always use sunscreen. Apply sunscreen liberally and repeatedly throughout the day.  Protect yourself by wearing long sleeves, pants, a wide-brimmed hat, and sunglasses whenever you are outside. Heart disease, diabetes, and high blood pressure  High blood pressure causes heart disease and increases the risk of stroke. High blood pressure is more  likely to develop in:  People who have blood pressure in the high end of the normal range (130-139/85-89 mm Hg).  People who are overweight or obese.  People who are African American.  If you are 9-62 years of age, have your blood pressure checked every 3-5 years. If you are 25 years of age or older, have your blood pressure checked every year. You should have your blood pressure measured twice-once when you are at a hospital or clinic, and once when you are not at a hospital or clinic. Record the average of the two measurements. To check your blood pressure when you are not at a hospital or clinic, you can use:  An automated blood pressure machine at a pharmacy.  A home blood pressure monitor.  If you are between 58 years and 84 years old, ask your health care provider if you should take aspirin to prevent strokes.  Have regular diabetes screenings. This involves taking a blood sample to  check your fasting blood sugar level.  If you are at a normal weight and have a low risk for diabetes, have this test once every three years after 36 years of age.  If you are overweight and have a high risk for diabetes, consider being tested at a younger age or more often. Preventing infection Hepatitis B  If you have a higher risk for hepatitis B, you should be screened for this virus. You are considered at high risk for hepatitis B if:  You were born in a country where hepatitis B is common. Ask your health care provider which countries are considered high risk.  Your parents were born in a high-risk country, and you have not been immunized against hepatitis B (hepatitis B vaccine).  You have HIV or AIDS.  You use needles to inject street drugs.  You live with someone who has hepatitis B.  You have had sex with someone who has hepatitis B.  You get hemodialysis treatment.  You take certain medicines for conditions, including cancer, organ transplantation, and autoimmune conditions. Hepatitis C  Blood testing is recommended for:  Everyone born from [birth year] through 1965.  Anyone with known risk factors for hepatitis C. Sexually transmitted infections (STIs)  You should be screened for sexually transmitted infections (STIs) including gonorrhea and chlamydia if:  You are sexually active and are younger than 36 years of age.  You are older than 36 years of age and your health care provider tells you that you are at risk for this type of infection.  Your sexual activity has changed since you were last screened and you are at an increased risk for chlamydia or gonorrhea. Ask your health care provider if you are at risk.  If you do not have HIV, but are at risk, it may be recommended that you take a prescription medicine daily to prevent HIV infection. This is called pre-exposure prophylaxis (PrEP). You are considered at risk if:  You are sexually active and do not regularly use  condoms or know the HIV status of your partner(s).  You take drugs by injection.  You are sexually active with a partner who has HIV. Talk with your health care provider about whether you are at high risk of being infected with HIV. If you choose to begin PrEP, you should first be tested for HIV. You should then be tested every 3 months for as long as you are taking PrEP. Pregnancy  If you are premenopausal and you may become pregnant, ask  your health care provider about preconception counseling.  If you may become pregnant, take 400 to 800 micrograms (mcg) of folic acid every day.  If you want to prevent pregnancy, talk to your health care provider about birth control (contraception). Osteoporosis and menopause  Osteoporosis is a disease in which the bones lose minerals and strength with aging. This can result in serious bone fractures. Your risk for osteoporosis can be identified using a bone density scan.  If you are 19 years of age or older, or if you are at risk for osteoporosis and fractures, ask your health care provider if you should be screened.  Ask your health care provider whether you should take a calcium or vitamin D supplement to lower your risk for osteoporosis.  Menopause may have certain physical symptoms and risks.  Hormone replacement therapy may reduce some of these symptoms and risks. Talk to your health care provider about whether hormone replacement therapy is right for you. Follow these instructions at home:  Schedule regular health, dental, and eye exams.  Stay current with your immunizations.  Do not use any tobacco products including cigarettes, chewing tobacco, or electronic cigarettes.  If you are pregnant, do not drink alcohol.  If you are breastfeeding, limit how much and how often you drink alcohol.  Limit alcohol intake to no more than 1 drink per day for nonpregnant women. One drink equals 12 ounces of beer, 5 ounces of wine, or 1 ounces of  hard liquor.  Do not use street drugs.  Do not share needles.  Ask your health care provider for help if you need support or information about quitting drugs.  Tell your health care provider if you often feel depressed.  Tell your health care provider if you have ever been abused or do not feel safe at home. This information is not intended to replace advice given to you by your health care provider. Make sure you discuss any questions you have with your health care provider. Document Released: 12/09/2010 Document Revised: 11/01/2015 Document Reviewed: 02/27/2015  2017 [employer]vier

## 2017-06-14 ENCOUNTER — Other Ambulatory Visit: Payer: Self-pay | Admitting: Family Medicine

## 2017-06-14 DIAGNOSIS — Z131 Encounter for screening for diabetes mellitus: Secondary | ICD-10-CM

## 2017-06-14 DIAGNOSIS — K219 Gastro-esophageal reflux disease without esophagitis: Secondary | ICD-10-CM

## 2017-06-14 DIAGNOSIS — Z13220 Encounter for screening for lipoid disorders: Secondary | ICD-10-CM

## 2017-06-15 ENCOUNTER — Other Ambulatory Visit (INDEPENDENT_AMBULATORY_CARE_PROVIDER_SITE_OTHER): Payer: BLUE CROSS/BLUE SHIELD

## 2017-06-15 DIAGNOSIS — Z13220 Encounter for screening for lipoid disorders: Secondary | ICD-10-CM

## 2017-06-15 DIAGNOSIS — Z131 Encounter for screening for diabetes mellitus: Secondary | ICD-10-CM | POA: Diagnosis not present

## 2017-06-15 LAB — BASIC METABOLIC PANEL
BUN: 7 mg/dL (ref 6–23)
CO2: 27 mEq/L (ref 19–32)
Calcium: 9 mg/dL (ref 8.4–10.5)
Chloride: 105 mEq/L (ref 96–112)
Creatinine, Ser: 0.7 mg/dL (ref 0.40–1.20)
GFR: 100.27 mL/min (ref >60.00)
GLUCOSE: 95 mg/dL (ref 70–99)
POTASSIUM: 4.2 meq/L (ref 3.5–5.1)
Sodium: 138 mEq/L (ref 135–145)

## 2017-06-15 LAB — LIPID PANEL
CHOL/HDL RATIO: 2
Cholesterol: 136 mg/dL (ref 0–200)
HDL: 57.4 mg/dL (ref >39.00)
LDL Cholesterol: 66 mg/dL (ref 0–99)
NONHDL: 79.09
Triglycerides: 67 mg/dL (ref 0.0–149.0)
VLDL: 13.4 mg/dL (ref 0.0–40.0)

## 2017-06-17 ENCOUNTER — Encounter: Payer: Self-pay | Admitting: Family Medicine

## 2017-06-17 ENCOUNTER — Telehealth: Payer: Self-pay | Admitting: Family Medicine

## 2017-06-17 ENCOUNTER — Ambulatory Visit (INDEPENDENT_AMBULATORY_CARE_PROVIDER_SITE_OTHER): Payer: BLUE CROSS/BLUE SHIELD | Admitting: Family Medicine

## 2017-06-17 VITALS — BP 118/68 | HR 88 | Temp 98.2°F | Ht 67.0 in | Wt 138.5 lb

## 2017-06-17 DIAGNOSIS — Z0000 Encounter for general adult medical examination without abnormal findings: Secondary | ICD-10-CM

## 2017-06-17 DIAGNOSIS — R002 Palpitations: Secondary | ICD-10-CM | POA: Diagnosis not present

## 2017-06-17 LAB — CBC WITH DIFFERENTIAL/PLATELET
BASOS PCT: 1 % (ref 0.0–3.0)
Basophils Absolute: 0.1 10*3/uL (ref 0.0–0.1)
Eosinophils Absolute: 0.1 10*3/uL (ref 0.0–0.7)
Eosinophils Relative: 2.2 % (ref 0.0–5.0)
HCT: 41.9 % (ref 36.0–46.0)
Hemoglobin: 14.1 g/dL (ref 12.0–15.0)
LYMPHS ABS: 2.3 10*3/uL (ref 0.7–4.0)
Lymphocytes Relative: 43.1 % (ref 12.0–46.0)
MCHC: 33.8 g/dL (ref 30.0–36.0)
MCV: 87 fl (ref 78.0–100.0)
MONO ABS: 0.3 10*3/uL (ref 0.1–1.0)
Monocytes Relative: 6.1 % (ref 3.0–12.0)
Neutro Abs: 2.5 10*3/uL (ref 1.4–7.7)
Neutrophils Relative %: 47.6 % (ref 43.0–77.0)
PLATELETS: 260 10*3/uL (ref 150.0–400.0)
RBC: 4.81 Mil/uL (ref 3.87–5.11)
RDW: 12.9 % (ref 11.5–15.5)
WBC: 5.3 10*3/uL (ref 4.0–10.5)

## 2017-06-17 LAB — TSH: TSH: 2.03 u[IU]/mL (ref 0.35–4.50)

## 2017-06-17 NOTE — Addendum Note (Signed)
Addended by: Ria Bush on: 06/17/2017 12:27 PM   Modules accepted: Orders

## 2017-06-17 NOTE — Assessment & Plan Note (Signed)
Preventative protocols reviewed and updated unless pt declined. Discussed healthy diet and lifestyle.  

## 2017-06-17 NOTE — Progress Notes (Addendum)
BP 118/68 (BP Location: Left Arm, Patient Position: Sitting, Cuff Size: Normal)   Pulse 88   Temp 98.2 F (36.8 C) (Oral)   Ht 5\' 7"  (1.702 m)   Wt 138 lb 8 oz (62.8 kg)   LMP 05/31/2017   SpO2 94%   BMI 21.69 kg/m    CC: CPE Subjective:    Patient ID: Marie Lester, female    DOB: May 02, 1981, 37 y.o.   MRN: [id number]  HPI: Marie Lester is a 37 y.o. female presenting on 06/17/2017 for Annual Exam   Intermittent tachypalpitations several times a week last several minutes at a time, especially associated with exercise (up to 180), she feels this but no dyspnea or activity limiting.   Preventative: Well woman - with Dr. Caffie Damme OBGYN. Labs through [facility].  Flu shot - declined Tdap 2014 Seat belt use discussed Sunscreen use discussed. No changing moles on skin. Non smoker Alcohol - none  Caffeine - 2 cups/day Lives with husband and 4 children, 1 dog Occupation: stay at home mom Edu: AA Activity: runs regularly, works out Diet: good water, fruits/vegetables daily, fish in diet  Relevant past medical, surgical, family and social history reviewed and updated as indicated. Interim medical history since our last visit reviewed. Allergies and medications reviewed and updated. Outpatient Medications Prior to Visit  Medication Sig Dispense Refill  . b complex vitamins tablet Take 1 tablet by mouth daily.    . Cholecalciferol (VITAMIN D-3) 5000 UNITS TABS Take 1 tablet by mouth daily.     No facility-administered medications prior to visit.      Per HPI unless specifically indicated in ROS section below Review of Systems  Constitutional: Positive for fever (with stomach flu). Negative for activity change, appetite change, chills, fatigue and unexpected weight change.  HENT: Negative for hearing loss.   Eyes: Negative for visual disturbance.  Respiratory: Negative for cough, chest tightness, shortness of breath and wheezing.   Cardiovascular: Positive for  palpitations (see HPI). Negative for chest pain and leg swelling.  Gastrointestinal: Positive for abdominal pain, diarrhea, nausea and vomiting. Negative for abdominal distention, blood in stool and constipation.  Genitourinary: Negative for difficulty urinating and hematuria.  Musculoskeletal: Negative for arthralgias, myalgias and neck pain.  Skin: Negative for rash.  Neurological: Negative for dizziness, seizures, syncope and headaches.  Hematological: Negative for adenopathy. Does not bruise/bleed easily.  Psychiatric/Behavioral: Negative for dysphoric mood. The patient is not nervous/anxious.        Objective:    BP 118/68 (BP Location: Left Arm, Patient Position: Sitting, Cuff Size: Normal)   Pulse 88   Temp 98.2 F (36.8 C) (Oral)   Ht 5\' 7"  (1.702 m)   Wt 138 lb 8 oz (62.8 kg)   LMP 05/31/2017   SpO2 94%   BMI 21.69 kg/m   Wt Readings from Last 3 Encounters:  06/17/17 138 lb 8 oz (62.8 kg)  06/12/16 144 lb 8 oz (65.5 kg)  07/13/15 145 lb 3.2 oz (65.9 kg)    Physical Exam  Constitutional: She is oriented to person, place, and time. She appears well-developed and well-nourished. No distress.  HENT:  Head: Normocephalic and atraumatic.  Right Ear: Hearing, tympanic membrane, external ear and ear canal normal.  Left Ear: Hearing, tympanic membrane, external ear and ear canal normal.  Nose: Nose normal.  Mouth/Throat: Uvula is midline, oropharynx is clear and moist and mucous membranes are normal. No oropharyngeal exudate, posterior oropharyngeal edema or posterior oropharyngeal erythema.  Eyes: Conjunctivae  and EOM are normal. Pupils are equal, round, and reactive to light. No scleral icterus.  Neck: Normal range of motion. Neck supple. No thyromegaly present.  Cardiovascular: Normal rate, regular rhythm, normal heart sounds and intact distal pulses.  No murmur heard. Pulses:      Radial pulses are 2+ on the right side, and 2+ on the left side.  Pulmonary/Chest: Effort  normal and breath sounds normal. No respiratory distress. She has no wheezes. She has no rales.  Abdominal: Soft. Bowel sounds are normal. She exhibits no distension and no mass. There is no tenderness. There is no rebound and no guarding.  Musculoskeletal: Normal range of motion. She exhibits no edema.  Lymphadenopathy:    She has no cervical adenopathy.  Neurological: She is alert and oriented to person, place, and time.  CN grossly intact, station and gait intact  Skin: Skin is warm and dry. No rash noted.  Psychiatric: She has a normal mood and affect. Her behavior is normal. Judgment and thought content normal.  Nursing note and vitals reviewed.  Results for orders placed or performed in visit on 32/95/18  Basic metabolic panel  Result Value Ref Range   Sodium 138 135 - 145 mEq/L   Potassium 4.2 3.5 - 5.1 mEq/L   Chloride 105 96 - 112 mEq/L   CO2 27 19 - 32 mEq/L   Glucose, Bld 95 70 - 99 mg/dL   BUN 7 6 - 23 mg/dL   Creatinine, Ser 0.70 0.40 - 1.20 mg/dL   Calcium 9.0 8.4 - 10.5 mg/dL   GFR 100.27 >60.00 mL/min  Lipid panel  Result Value Ref Range   Cholesterol 136 0 - 200 mg/dL   Triglycerides 67.0 0.0 - 149.0 mg/dL   HDL 57.40 >39.00 mg/dL   VLDL 13.4 0.0 - 40.0 mg/dL   LDL Cholesterol 66 0 - 99 mg/dL   Total CHOL/HDL Ratio 2    NonHDL 79.09   No results found for: TSH   EKG - NSR rate 75, normal axis, intervals, no acute ST/T changes.    Assessment & Plan:   Problem List Items Addressed This Visit    Health maintenance examination - Primary    Preventative protocols reviewed and updated unless pt declined. Discussed healthy diet and lifestyle.       Palpitations    Endorses tachypalpitations several times a week up to 110 HR, as well as exertion related racing heart up to 180 at a time (she is a longterm runner). Reviewed caffeine intake. Will get baseline EKG today. Discussed cards referral - pt will consider and let me know if desired.  Pt states prior TSH was  normal - will recheck at next labs.       Relevant Orders   EKG 12-Lead (Completed)   TSH   CBC with Differential/Platelet       Follow up plan: Return in about 1 year (around 06/17/2018) for annual exam, prior fasting for blood work.  Ria Bush, MD

## 2017-06-17 NOTE — Assessment & Plan Note (Signed)
Endorses tachypalpitations several times a week up to 110 HR, as well as exertion related racing heart up to 180 at a time (she is a longterm runner). Reviewed caffeine intake. Will get baseline EKG today. Discussed cards referral - pt will consider and let me know if desired.  Pt states prior TSH was normal - will recheck at next labs.

## 2017-06-17 NOTE — Telephone Encounter (Signed)
Signed result note early - plz notify thyroid and blood counts returned normal as well.

## 2017-06-17 NOTE — Patient Instructions (Addendum)
EKG today Watch for irregular heart beat or episodes >120 at rest and let me know for heart doctor referral if that happens.  Health Maintenance, Female Adopting a healthy lifestyle and getting preventive care can go a long way to promote health and wellness. Talk with your health care provider about what schedule of regular examinations is right for you. This is a good chance for you to check in with your provider about disease prevention and staying healthy. In between checkups, there are plenty of things you can do on your own. Experts have done a lot of research about which lifestyle changes and preventive measures are most likely to keep you healthy. Ask your health care provider for more information. Weight and diet Eat a healthy diet  Be sure to include plenty of vegetables, fruits, low-fat dairy products, and lean protein.  Do not eat a lot of foods high in solid fats, added sugars, or salt.  Get regular exercise. This is one of the most important things you can do for your health. ? Most adults should exercise for at least 150 minutes each week. The exercise should increase your heart rate and make you sweat (moderate-intensity exercise). ? Most adults should also do strengthening exercises at least twice a week. This is in addition to the moderate-intensity exercise.  Maintain a healthy weight  Body mass index (BMI) is a measurement that can be used to identify possible weight problems. It estimates body fat based on height and weight. Your health care provider can help determine your BMI and help you achieve or maintain a healthy weight.  For females 27 years of age and older: ? A BMI below 18.5 is considered underweight. ? A BMI of 18.5 to 24.9 is normal. ? A BMI of 25 to 29.9 is considered overweight. ? A BMI of 30 and above is considered obese.  Watch levels of cholesterol and blood lipids  You should start having your blood tested for lipids and cholesterol at 37 years of  age, then have this test every 5 years.  You may need to have your cholesterol levels checked more often if: ? Your lipid or cholesterol levels are high. ? You are older than 37 years of age. ? You are at high risk for heart disease.  Cancer screening Lung Cancer  Lung cancer screening is recommended for adults 94-62 years old who are at high risk for lung cancer because of a history of smoking.  A yearly low-dose CT scan of the lungs is recommended for people who: ? Currently smoke. ? Have quit within the past 15 years. ? Have at least a 30-pack-year history of smoking. A pack year is smoking an average of one pack of cigarettes a day for 1 year.  Yearly screening should continue until it has been 15 years since you quit.  Yearly screening should stop if you develop a health problem that would prevent you from having lung cancer treatment.  Breast Cancer  Practice breast self-awareness. This means understanding how your breasts normally appear and feel.  It also means doing regular breast self-exams. Let your health care provider know about any changes, no matter how small.  If you are in your 20s or 30s, you should have a clinical breast exam (CBE) by a health care provider every 1-3 years as part of a regular health exam.  If you are 23 or older, have a CBE every year. Also consider having a breast X-ray (mammogram) every year.  If  you have a family history of breast cancer, talk to your health care provider about genetic screening.  If you are at high risk for breast cancer, talk to your health care provider about having an MRI and a mammogram every year.  Breast cancer gene (BRCA) assessment is recommended for women who have family members with BRCA-related cancers. BRCA-related cancers include: ? Breast. ? Ovarian. ? Tubal. ? Peritoneal cancers.  Results of the assessment will determine the need for genetic counseling and BRCA1 and BRCA2 testing.  Cervical  Cancer Your health care provider may recommend that you be screened regularly for cancer of the pelvic organs (ovaries, uterus, and vagina). This screening involves a pelvic examination, including checking for microscopic changes to the surface of your cervix (Pap test). You may be encouraged to have this screening done every 3 years, beginning at age 68.  For women ages 86-65, health care providers may recommend pelvic exams and Pap testing every 3 years, or they may recommend the Pap and pelvic exam, combined with testing for human papilloma virus (HPV), every 5 years. Some types of HPV increase your risk of cervical cancer. Testing for HPV may also be done on women of any age with unclear Pap test results.  Other health care providers may not recommend any screening for nonpregnant women who are considered low risk for pelvic cancer and who do not have symptoms. Ask your health care provider if a screening pelvic exam is right for you.  If you have had past treatment for cervical cancer or a condition that could lead to cancer, you need Pap tests and screening for cancer for at least 20 years after your treatment. If Pap tests have been discontinued, your risk factors (such as having a new sexual partner) need to be reassessed to determine if screening should resume. Some women have medical problems that increase the chance of getting cervical cancer. In these cases, your health care provider may recommend more frequent screening and Pap tests.  Colorectal Cancer  This type of cancer can be detected and often prevented.  Routine colorectal cancer screening usually begins at 37 years of age and continues through 37 years of age.  Your health care provider may recommend screening at an earlier age if you have risk factors for colon cancer.  Your health care provider may also recommend using home test kits to check for hidden blood in the stool.  A small camera at the end of a tube can be used to  examine your colon directly (sigmoidoscopy or colonoscopy). This is done to check for the earliest forms of colorectal cancer.  Routine screening usually begins at age 17.  Direct examination of the colon should be repeated every 5-10 years through 37 years of age. However, you may need to be screened more often if early forms of precancerous polyps or small growths are found.  Skin Cancer  Check your skin from head to toe regularly.  Tell your health care provider about any new moles or changes in moles, especially if there is a change in a mole's shape or color.  Also tell your health care provider if you have a mole that is larger than the size of a pencil eraser.  Always use sunscreen. Apply sunscreen liberally and repeatedly throughout the day.  Protect yourself by wearing long sleeves, pants, a wide-brimmed hat, and sunglasses whenever you are outside.  Heart disease, diabetes, and high blood pressure  High blood pressure causes heart disease and increases  the risk of stroke. High blood pressure is more likely to develop in: ? People who have blood pressure in the high end of the normal range (130-139/85-89 mm Hg). ? People who are overweight or obese. ? People who are African American.  If you are 41-17 years of age, have your blood pressure checked every 3-5 years. If you are 40 years of age or older, have your blood pressure checked every year. You should have your blood pressure measured twice-once when you are at a hospital or clinic, and once when you are not at a hospital or clinic. Record the average of the two measurements. To check your blood pressure when you are not at a hospital or clinic, you can use: ? An automated blood pressure machine at a pharmacy. ? A home blood pressure monitor.  If you are between 10 years and 68 years old, ask your health care provider if you should take aspirin to prevent strokes.  Have regular diabetes screenings. This involves taking a  blood sample to check your fasting blood sugar level. ? If you are at a normal weight and have a low risk for diabetes, have this test once every three years after 37 years of age. ? If you are overweight and have a high risk for diabetes, consider being tested at a younger age or more often. Preventing infection Hepatitis B  If you have a higher risk for hepatitis B, you should be screened for this virus. You are considered at high risk for hepatitis B if: ? You were born in a country where hepatitis B is common. Ask your health care provider which countries are considered high risk. ? Your parents were born in a high-risk country, and you have not been immunized against hepatitis B (hepatitis B vaccine). ? You have HIV or AIDS. ? You use needles to inject street drugs. ? You live with someone who has hepatitis B. ? You have had sex with someone who has hepatitis B. ? You get hemodialysis treatment. ? You take certain medicines for conditions, including cancer, organ transplantation, and autoimmune conditions.  Hepatitis C  Blood testing is recommended for: ? Everyone born from [birth year] through 1965. ? Anyone with known risk factors for hepatitis C.  Sexually transmitted infections (STIs)  You should be screened for sexually transmitted infections (STIs) including gonorrhea and chlamydia if: ? You are sexually active and are younger than 37 years of age. ? You are older than 37 years of age and your health care provider tells you that you are at risk for this type of infection. ? Your sexual activity has changed since you were last screened and you are at an increased risk for chlamydia or gonorrhea. Ask your health care provider if you are at risk.  If you do not have HIV, but are at risk, it may be recommended that you take a prescription medicine daily to prevent HIV infection. This is called pre-exposure prophylaxis (PrEP). You are considered at risk if: ? You are sexually active and  do not regularly use condoms or know the HIV status of your partner(s). ? You take drugs by injection. ? You are sexually active with a partner who has HIV.  Talk with your health care provider about whether you are at high risk of being infected with HIV. If you choose to begin PrEP, you should first be tested for HIV. You should then be tested every 3 months for as long as you are taking PrEP.  Pregnancy  If you are premenopausal and you may become pregnant, ask your health care provider about preconception counseling.  If you may become pregnant, take 400 to 800 micrograms (mcg) of folic acid every day.  If you want to prevent pregnancy, talk to your health care provider about birth control (contraception). Osteoporosis and menopause  Osteoporosis is a disease in which the bones lose minerals and strength with aging. This can result in serious bone fractures. Your risk for osteoporosis can be identified using a bone density scan.  If you are 37 years of age or older, or if you are at risk for osteoporosis and fractures, ask your health care provider if you should be screened.  Ask your health care provider whether you should take a calcium or vitamin D supplement to lower your risk for osteoporosis.  Menopause may have certain physical symptoms and risks.  Hormone replacement therapy may reduce some of these symptoms and risks. Talk to your health care provider about whether hormone replacement therapy is right for you. Follow these instructions at home:  Schedule regular health, dental, and eye exams.  Stay current with your immunizations.  Do not use any tobacco products including cigarettes, chewing tobacco, or electronic cigarettes.  If you are pregnant, do not drink alcohol.  If you are breastfeeding, limit how much and how often you drink alcohol.  Limit alcohol intake to no more than 1 drink per day for nonpregnant women. One drink equals 12 ounces of beer, 5 ounces of  wine, or 1 ounces of hard liquor.  Do not use street drugs.  Do not share needles.  Ask your health care provider for help if you need support or information about quitting drugs.  Tell your health care provider if you often feel depressed.  Tell your health care provider if you have ever been abused or do not feel safe at home. This information is not intended to replace advice given to you by your health care provider. Make sure you discuss any questions you have with your health care provider. Document Released: 12/09/2010 Document Revised: 11/01/2015 Document Reviewed: 02/27/2015 Elsevier Interactive Patient Education  [employer].

## 2017-06-18 NOTE — Telephone Encounter (Signed)
Left message on vm per dpr relaying results per Dr. G.  

## 2017-08-14 ENCOUNTER — Ambulatory Visit: Payer: Self-pay | Admitting: *Deleted

## 2017-08-14 NOTE — Telephone Encounter (Signed)
Called in c/o her eyelids being swollen.   She stated,  "To look at me you would not know they are swollen".   "It's not bad but 2 weeks ago I started loosing my eyelashes".    "I think it was from using old mascara".  I saw an eye doctor I go to church with and she said I had "blepharitis".   She instructed me to wash my eyes gently with Wynetta Emery' baby wash and to use warm compresses on my eyes.   I've been doing that.   I'm no longer loosing my eyelashes.     She was wondering if she needed to be on some kind of eye drop.   She has an upcoming appt with Dr. Danise Mina on 08/17/17 at 9:15am.     I advised her to touch base with her eye doctor today since she had already been seen by her and would know if she needed an eye drop or not since it would be Monday before she saw Dr. Danise Mina.    She agreed to this plan.  I instructed her to call us back if this did not work out.   She assured me she would call us back if she could not get in touch with her eye doctor.

## 2017-08-17 ENCOUNTER — Ambulatory Visit: Payer: BLUE CROSS/BLUE SHIELD | Admitting: Family Medicine

## 2017-12-16 ENCOUNTER — Telehealth: Payer: Self-pay

## 2017-12-16 NOTE — Telephone Encounter (Signed)
Spoke with pt informing her we only show a Tdap in her chart and it was given 05/25/2013. So she is good until 2024.  Expresses her thanks for the call and says she will have to get MMR later.

## 2017-12-16 NOTE — Telephone Encounter (Signed)
Copied from [place] [id number]. Topic: Quick Communication - See Telephone Encounter >> Dec 16, 2017 12:59 PM Hewitt Shorts wrote: Pt is getting ready to go out of country and is needing to know if she is up today on tetanus and MMR and would like to know if this information can be sent to the home address if she needs it again  Best number [phone]

## 2017-12-18 ENCOUNTER — Encounter: Payer: BLUE CROSS/BLUE SHIELD | Admitting: Internal Medicine

## 2017-12-18 ENCOUNTER — Ambulatory Visit (INDEPENDENT_AMBULATORY_CARE_PROVIDER_SITE_OTHER): Payer: BLUE CROSS/BLUE SHIELD | Admitting: Internal Medicine

## 2017-12-18 DIAGNOSIS — Z23 Encounter for immunization: Secondary | ICD-10-CM

## 2017-12-18 DIAGNOSIS — Z9189 Other specified personal risk factors, not elsewhere classified: Secondary | ICD-10-CM | POA: Diagnosis not present

## 2017-12-18 DIAGNOSIS — Z789 Other specified health status: Secondary | ICD-10-CM | POA: Diagnosis not present

## 2017-12-18 DIAGNOSIS — Z7189 Other specified counseling: Secondary | ICD-10-CM

## 2017-12-18 DIAGNOSIS — Z7184 Encounter for health counseling related to travel: Secondary | ICD-10-CM

## 2017-12-18 NOTE — Progress Notes (Signed)
Subjective:   Synethia Endicott is a 37 y.o. female who presents to the Infectious Disease clinic for travel consultation. Planned departure date: august 4         Planned return date: august 10 Countries of travel: [place] Areas in country: [place] Accommodations: staying with friend Purpose of travel: church Prior travel out of [place]: yes - [place]     Objective:   Medications: none    Assessment:   No contraindications to travel. none    Plan:  Pre travel vaccinations = will give Hep A #1 Typhoid inj today Already has yellow fever,   Traveler's diarrhea = gave precautions and has azithro for traveller diarrhea  Mosquito bite prevention = gave recs on deet and premethrin

## 2018-03-09 ENCOUNTER — Telehealth: Payer: Self-pay

## 2018-03-09 DIAGNOSIS — R002 Palpitations: Secondary | ICD-10-CM

## 2018-03-09 NOTE — Telephone Encounter (Signed)
Copied from [place] [id number]. Topic: General - Other >> Mar 09, 2018  9:02 AM Carolyn Stare wrote:  Pt call to say she was told if she is having heart issues where her heart is fluttering to contact him and he would arrange for a heart monitor idiopathic thrombocytopenic purpura is happening regular

## 2018-03-09 NOTE — Telephone Encounter (Signed)
06/17/17 annual exam noted if irregular heart beat or > 120 may do cardiology referral.Please advise.

## 2018-03-09 NOTE — Telephone Encounter (Signed)
Referral placed.  She was to call me if she decided to pursue cardiac evaluation.

## 2018-03-09 NOTE — Telephone Encounter (Signed)
Cardiology Appt 05/05/18 with Dr Rockey Situ. Patient was wondering if you want her to wait to see Cardiology to have them order Heart Monitor or if this is something she can come in to see you again and you can order before she sees Cardiology. The palpitations are happening more frequently.

## 2018-03-09 NOTE — Telephone Encounter (Signed)
Would want her to see cards for this. Can we get her in sooner in [place]?

## 2018-03-11 NOTE — Telephone Encounter (Signed)
Called patient and had to leave her a voicemail for Dr Gutierrez's recommendations, asked her to call me back.

## 2018-04-20 ENCOUNTER — Ambulatory Visit: Payer: BLUE CROSS/BLUE SHIELD | Admitting: Family Medicine

## 2018-04-20 ENCOUNTER — Encounter: Payer: Self-pay | Admitting: Family Medicine

## 2018-04-20 VITALS — BP 118/74 | HR 83 | Temp 98.7°F | Ht 67.0 in | Wt 143.8 lb

## 2018-04-20 DIAGNOSIS — R002 Palpitations: Secondary | ICD-10-CM | POA: Diagnosis not present

## 2018-04-20 DIAGNOSIS — J019 Acute sinusitis, unspecified: Secondary | ICD-10-CM

## 2018-04-20 NOTE — Progress Notes (Signed)
BP 118/74 (BP Location: Left Arm, Patient Position: Sitting, Cuff Size: Normal)   Pulse 83   Temp 98.7 F (37.1 C) (Oral)   Ht 5\' 7"  (1.702 m)   Wt 143 lb 12 oz (65.2 kg)   LMP 04/07/2018   SpO2 99%   BMI 22.51 kg/m    CC: sinus congestion Subjective:    Patient ID: Vickey Boak, female    DOB: 12-Dec-1980, 37 y.o.   MRN: [id number]  HPI: Warrene Kapfer is a 37 y.o. female presenting on 04/20/2018 for Sinus Problem (C/o productive cough, sore ribs from coughing, sinus pressure and bilateral ear pressure. Traveled out of country last week. Does not want to take abx, if possible. )   Recent trip to [place], [place]. Developed tension headache and ST prior to return. Over weekend started feeling worse - fever 100.4, productive cough, bilateral eye pressure, earache and pressure. Flying made things worse, arrived Saturday morning. Ribs sore from coughing. Some tooth pain. ST and PNDrainage persists. Head > chest congestion.   Taking pseudophed (worsened abd pain). NSAIDs worsen GERD. Tylenol helpful. Tried neti pot. Using apple cider vinegar. Using flonase as well.  Sick contacts at home.   No dyspnea or wheezing.  Non smoker No h/o asthma.   Ongoing intermittent tachy palpitations during jogging exercise - outside of normal as she is lifelong runner. EKG 06/2017 was normal.   Relevant past medical, surgical, family and social history reviewed and updated as indicated. Interim medical history since our last visit reviewed. Allergies and medications reviewed and updated. No outpatient medications prior to visit.   No facility-administered medications prior to visit.      Per HPI unless specifically indicated in ROS section below Review of Systems     Objective:    BP 118/74 (BP Location: Left Arm, Patient Position: Sitting, Cuff Size: Normal)   Pulse 83   Temp 98.7 F (37.1 C) (Oral)   Ht 5\' 7"  (1.702 m)   Wt 143 lb 12 oz (65.2 kg)   LMP 04/07/2018   SpO2 99%   BMI 22.51 kg/m    Wt Readings from Last 3 Encounters:  04/20/18 143 lb 12 oz (65.2 kg)  06/17/17 138 lb 8 oz (62.8 kg)  06/12/16 144 lb 8 oz (65.5 kg)    Physical Exam  Constitutional: She appears well-developed and well-nourished. No distress.  HENT:  Head: Normocephalic and atraumatic.  Right Ear: Hearing, tympanic membrane, external ear and ear canal normal.  Left Ear: Hearing, tympanic membrane, external ear and ear canal normal.  Nose: Mucosal edema (nasal mucosal congestion and edema) present. No rhinorrhea. Right sinus exhibits maxillary sinus tenderness. Right sinus exhibits no frontal sinus tenderness. Left sinus exhibits maxillary sinus tenderness. Left sinus exhibits no frontal sinus tenderness.  Mouth/Throat: Uvula is midline and mucous membranes are normal. Posterior oropharyngeal erythema present. No oropharyngeal exudate, posterior oropharyngeal edema or tonsillar abscesses.  Eyes: Pupils are equal, round, and reactive to light. Conjunctivae and EOM are normal. No scleral icterus.  Neck: Normal range of motion. Neck supple.  Cardiovascular: Normal rate, regular rhythm and intact distal pulses.  Murmur (3/6 systolic) heard. Pulmonary/Chest: Effort normal and breath sounds normal. No respiratory distress. She has no wheezes. She has no rales.  Lymphadenopathy:    She has no cervical adenopathy.  Skin: Skin is warm and dry. No rash noted.  Nursing note and vitals reviewed.  Results for orders placed or performed in visit on 06/17/17  TSH  Result Value Ref  Range   TSH 2.03 0.35 - 4.50 uIU/mL  CBC with Differential/Platelet  Result Value Ref Range   WBC 5.3 4.0 - 10.5 K/uL   RBC 4.81 3.87 - 5.11 Mil/uL   Hemoglobin 14.1 12.0 - 15.0 g/dL   HCT 41.9 36.0 - 46.0 %   MCV 87.0 78.0 - 100.0 fl   MCHC 33.8 30.0 - 36.0 g/dL   RDW 12.9 11.5 - 15.5 %   Platelets 260.0 150.0 - 400.0 K/uL   Neutrophils Relative % 47.6 43.0 - 77.0 %   Lymphocytes Relative 43.1 12.0 - 46.0 %   Monocytes Relative  6.1 3.0 - 12.0 %   Eosinophils Relative 2.2 0.0 - 5.0 %   Basophils Relative 1.0 0.0 - 3.0 %   Neutro Abs 2.5 1.4 - 7.7 K/uL   Lymphs Abs 2.3 0.7 - 4.0 K/uL   Monocytes Absolute 0.3 0.1 - 1.0 K/uL   Eosinophils Absolute 0.1 0.0 - 0.7 K/uL   Basophils Absolute 0.1 0.0 - 0.1 K/uL      Assessment & Plan:   Problem List Items Addressed This Visit    Palpitations    Ongoing tachy palpitations during light jog. Will refer to cardiology for further evaluation.       Relevant Orders   Ambulatory referral to Cardiology   Acute sinusitis - Primary    Anticipate viral given short duration, recent air travel likely exacerbating sinus pressure. Supportive care reviewed as well as reasons suggesting bacterial sinusitis to notify us for antibiotic course.           No orders of the defined types were placed in this encounter.  Orders Placed This Encounter  Procedures  . Ambulatory referral to Cardiology    Referral Priority:   Routine    Referral Type:   Consultation    Referral Reason:   Specialty Services Required    Requested Specialty:   Cardiology    Number of Visits Requested:   1    Follow up plan: Return if symptoms worsen or fail to improve.  Ria Bush, MD

## 2018-04-20 NOTE — Assessment & Plan Note (Signed)
Ongoing tachy palpitations during light jog. Will refer to cardiology for further evaluation.

## 2018-04-20 NOTE — Assessment & Plan Note (Signed)
Anticipate viral given short duration, recent air travel likely exacerbating sinus pressure. Supportive care reviewed as well as reasons suggesting bacterial sinusitis to notify us for antibiotic course.

## 2018-04-20 NOTE — Patient Instructions (Signed)
You have a sinus infection, likely viral. Push fluids and plenty of rest. Continue tylenol and flonase. Nasal saline irrigation or neti pot to help drain sinuses.  May use plain mucinex with plenty of fluid to help mobilize mucous. Cough syrpu at night time.  Please let us know if fever >101.5, trouble opening/closing mouth, difficulty swallowing, or worsening instead of improving as expected.  We will refer you to local cardiology for evaluation of racing heart with exercise

## 2018-04-28 ENCOUNTER — Telehealth: Payer: Self-pay | Admitting: *Deleted

## 2018-04-28 MED ORDER — DOXYCYCLINE HYCLATE 100 MG PO TABS: 100.0000 mg | ORAL_TABLET | Freq: Two times a day (BID) | ORAL | 0 refills | Status: DC

## 2018-04-28 NOTE — Telephone Encounter (Signed)
Patient called stating that she saw Dr. Danise Mina last week and has gotten worse. Patient stated that she is coughing all the time day and night. Patient stated that the cough is productive -yellow and started back with a fever today of 100. Patient wants to know if something can be called in since she was seen last week?  Pharmacy-[facility]

## 2018-04-28 NOTE — Telephone Encounter (Signed)
Left message on vm per dpr relaying message and instructions per Dr. G. 

## 2018-04-28 NOTE — Telephone Encounter (Signed)
Sorry she's no better! Yes please take antibiotic sent to pharmacy - I've sent in doxycycline to take twice daily with meals for 10 days, avoid too much sun while taking it or put sunscreen on if outdoors as it makes you more prone to sunburn.

## 2018-05-05 ENCOUNTER — Ambulatory Visit: Payer: BLUE CROSS/BLUE SHIELD | Admitting: Cardiovascular Disease

## 2018-07-12 ENCOUNTER — Ambulatory Visit: Payer: BLUE CROSS/BLUE SHIELD | Admitting: Cardiovascular Disease

## 2019-01-08 DIAGNOSIS — U071 COVID-19: Secondary | ICD-10-CM

## 2019-01-08 HISTORY — DX: COVID-19: U07.1

## 2019-01-14 ENCOUNTER — Other Ambulatory Visit: Payer: Self-pay

## 2019-01-14 DIAGNOSIS — Z20822 Contact with and (suspected) exposure to covid-19: Secondary | ICD-10-CM

## 2019-01-16 ENCOUNTER — Telehealth: Payer: Self-pay | Admitting: Family Medicine

## 2019-01-16 LAB — NOVEL CORONAVIRUS, NAA: SARS-CoV-2, NAA: DETECTED — AB

## 2019-01-16 NOTE — Telephone Encounter (Addendum)
Noted. plz call Monday for update on symptoms, offer f/u virtual visit if pt desires otherwise we will call her later in the week for another update. plz get date of symptom onset as well.  Plz fill out health department form.

## 2019-01-16 NOTE — Telephone Encounter (Signed)
Spoke w pt regarding her +COVID test. Discussed quarantining and warning s/s's to look out for. All questions answered.

## 2019-01-18 NOTE — Telephone Encounter (Signed)
Spoke with pt asking how she is doing.   Says she feels good.  Just dealing with some allergy sxs (nasal congestion).   States she lost sense of taste/smell on 01/10/19 but that came back yesterday.

## 2019-01-18 NOTE — Telephone Encounter (Signed)
Faxed form to [place] at [phone].

## 2019-07-14 ENCOUNTER — Encounter: Payer: Self-pay | Admitting: Family Medicine

## 2019-07-14 ENCOUNTER — Other Ambulatory Visit: Payer: Self-pay

## 2019-07-14 ENCOUNTER — Ambulatory Visit: Payer: BLUE CROSS/BLUE SHIELD | Admitting: Family Medicine

## 2019-07-14 VITALS — BP 120/70 | HR 85 | Temp 97.9°F | Ht 67.0 in | Wt 146.6 lb

## 2019-07-14 DIAGNOSIS — N3001 Acute cystitis with hematuria: Secondary | ICD-10-CM

## 2019-07-14 DIAGNOSIS — R300 Dysuria: Secondary | ICD-10-CM

## 2019-07-14 DIAGNOSIS — M222X1 Patellofemoral disorders, right knee: Secondary | ICD-10-CM

## 2019-07-14 LAB — POC URINALSYSI DIPSTICK (AUTOMATED)
Bilirubin, UA: NEGATIVE
Glucose, UA: NEGATIVE
Ketones, UA: NEGATIVE
Nitrite, UA: NEGATIVE
Protein, UA: NEGATIVE
Spec Grav, UA: 1.01 (ref 1.010–1.025)
Urobilinogen, UA: 0.2 E.U./dL
pH, UA: 6.5 (ref 5.0–8.0)

## 2019-07-14 MED ORDER — CEPHALEXIN 500 MG PO CAPS: 500.0000 mg | ORAL_CAPSULE | Freq: Two times a day (BID) | ORAL | 0 refills | Status: DC

## 2019-07-14 NOTE — Patient Instructions (Addendum)
You do have UTI - treat with keflex antibiotic sent to pharmacy For runner's knee - do exercises provided. Ok to take ibuprofen for discomfort. Also try topical voltaren gel to knee. Ice/heat as needed Good to see you today. Let us know if not improving for PT referral.

## 2019-07-14 NOTE — Progress Notes (Signed)
This visit was conducted in person.  BP 120/70 (BP Location: Left Arm, Patient Position: Sitting, Cuff Size: Normal)   Pulse 85   Temp 97.9 F (36.6 C) (Temporal)   Ht 5\' 7"  (1.702 m)   Wt 146 lb 9 oz (66.5 kg)   LMP 07/03/2019   SpO2 99%   BMI 22.95 kg/m    CC: UTI? Subjective:    Patient ID: Marie Lester, female    DOB: 1981-04-10, 39 y.o.   MRN: [id number]  HPI: Marie Lester is a 39 y.o. female presenting on 07/14/2019 for Dysuria (C/o burning with urination and lower abd pressure.  Sxs started this morning. ) and Knee Pain (C/o pain and clicking in right knee.  Started about 3 wks ago. )   1d h/o dysuria, lower abd pressure, urgency and frequency. No hematuria. No flank pain, fevers/chills, nausea. No recent abx. Drinking plenty of water. Thinks different undergarment caused this - she has since changed.   R posterior knee and medial patellar pain for last 3 weeks. Feels twisting sensation like knee needs to pop. She did have knee pain several months ago - improved after taking a break from running. Normally runs 3-5 miles 3-4 times a week. She did recently switch to ultra shoes. Denies inciting trauma/injury or falls.   covid + 01/2019 - symptoms fully resolved. She did have desquamation of bilateral soles 6 wks afterwards.      Relevant past medical, surgical, family and social history reviewed and updated as indicated. Interim medical history since our last visit reviewed. Allergies and medications reviewed and updated. Outpatient Medications Prior to Visit  Medication Sig Dispense Refill  . doxycycline (VIBRA-TABS) 100 MG tablet Take 1 tablet (100 mg total) by mouth 2 (two) times daily. 20 tablet 0   No facility-administered medications prior to visit.     Per HPI unless specifically indicated in ROS section below Review of Systems Objective:    BP 120/70 (BP Location: Left Arm, Patient Position: Sitting, Cuff Size: Normal)   Pulse 85   Temp 97.9 F (36.6 C)  (Temporal)   Ht 5\' 7"  (1.702 m)   Wt 146 lb 9 oz (66.5 kg)   LMP 07/03/2019   SpO2 99%   BMI 22.95 kg/m   Wt Readings from Last 3 Encounters:  07/14/19 146 lb 9 oz (66.5 kg)  04/20/18 143 lb 12 oz (65.2 kg)  06/17/17 138 lb 8 oz (62.8 kg)    Physical Exam Vitals and nursing note reviewed.  Constitutional:      Appearance: Normal appearance. She is not ill-appearing.  Abdominal:     General: Abdomen is flat. Bowel sounds are normal. There is no distension.     Palpations: Abdomen is soft. There is no mass.     Tenderness: There is abdominal tenderness (mild pressure) in the suprapubic area. There is no right CVA tenderness, left CVA tenderness, guarding or rebound.     Hernia: No hernia is present.  Musculoskeletal:        General: No tenderness. Normal range of motion.     Comments:  L knee WNL R knee exam: No deformity on inspection. Discomfort with palpation of knee medial to patella  No effusion/swelling noted. FROM in flex/extension without crepitus. No popliteal fullness. Neg drawer test. Neg mcmurray test. No pain with valgus/varus stress. Pain with PFgrind. No abnormal patellar mobility.   Skin:    General: Skin is warm and dry.     Capillary Refill: Capillary refill  takes less than 2 seconds.     Findings: No rash.  Neurological:     Mental Status: She is alert.  Psychiatric:        Mood and Affect: Mood normal.        Behavior: Behavior normal.       Results for orders placed or performed in visit on 07/14/19  POCT Urinalysis Dipstick (Automated)  Result Value Ref Range   Color, UA yellow    Clarity, UA cloudy    Glucose, UA Negative Negative   Bilirubin, UA negative    Ketones, UA negative    Spec Grav, UA 1.010 1.010 - 1.025   Blood, UA 3+    pH, UA 6.5 5.0 - 8.0   Protein, UA Negative Negative   Urobilinogen, UA 0.2 0.2 or 1.0 E.U./dL   Nitrite, UA negative    Leukocytes, UA Large (3+) (A) Negative   Assessment & Plan:  This visit occurred  during the SARS-CoV-2 public health emergency.  Safety protocols were in place, including screening questions prior to the visit, additional usage of staff PPE, and extensive cleaning of exam room while observing appropriate contact time as indicated for disinfecting solutions.   Problem List Items Addressed This Visit    UTI (urinary tract infection) - Primary    Story and UA/micro consistent with UTI - treat with keflex 1 wk course. Update if persistent symptoms past treatment.  No significant RBCs on micro- no need to return for rpt UA.       Relevant Medications   cephALEXin (KEFLEX) 500 MG capsule   Patellofemoral pain syndrome of right knee    Story/exam consistent with R runner's knee.  Reviewed etiology, handout provided.  rec ibuprofen, ice/heat, rest for acute phase then reviewed quad strengthening (focusing on VMO) and watching effect of shoewear on pain.  Update if not improving as expected for formal PT course.        Other Visit Diagnoses    Dysuria       Relevant Orders   POCT Urinalysis Dipstick (Automated) (Completed)       Meds ordered this encounter  Medications  . cephALEXin (KEFLEX) 500 MG capsule    Sig: Take 1 capsule (500 mg total) by mouth 2 (two) times daily.    Dispense:  14 capsule    Refill:  0   Orders Placed This Encounter  Procedures  . POCT Urinalysis Dipstick (Automated)    Patient Instructions  You do have UTI - treat with keflex antibiotic sent to pharmacy For runner's knee - do exercises provided. Ok to take ibuprofen for discomfort. Also try topical voltaren gel to knee. Ice/heat as needed Good to see you today. Let us know if not improving for PT referral.    Follow up plan: Return if symptoms worsen or fail to improve.  Ria Bush, MD

## 2019-07-14 NOTE — Assessment & Plan Note (Signed)
Story and UA/micro consistent with UTI - treat with keflex 1 wk course. Update if persistent symptoms past treatment.  No significant RBCs on micro- no need to return for rpt UA.

## 2019-07-14 NOTE — Assessment & Plan Note (Addendum)
Story/exam consistent with R runner's knee.  Reviewed etiology, handout provided.  rec ibuprofen, ice/heat, rest for acute phase then reviewed quad strengthening (focusing on VMO) and watching effect of shoewear on pain.  Update if not improving as expected for formal PT course.

## 2019-09-08 ENCOUNTER — Telehealth: Payer: Self-pay

## 2019-09-08 DIAGNOSIS — M222X1 Patellofemoral disorders, right knee: Secondary | ICD-10-CM

## 2019-09-08 NOTE — Telephone Encounter (Signed)
Patient called back Advised of message below from Dr Darnell Level. Patient voiced understanding  We cancelled next weeks appt., will await a call about referral

## 2019-09-08 NOTE — Telephone Encounter (Signed)
Lvm asking pt to call back.  Need to relay Dr. G's message.  

## 2019-09-08 NOTE — Telephone Encounter (Signed)
Noted  

## 2019-09-08 NOTE — Telephone Encounter (Signed)
Pt has continued to have right knee pain since recent office visit in February. It has increased in pain to about a 7. Asking to be referred to an orthopedic in [place].   If she is able to get a referral, should she cancel her appt on 4-5 wth Dr Danise Mina

## 2019-09-08 NOTE — Telephone Encounter (Signed)
Sure I will place referral to ortho for worsening knee pain.  Ok to cancel next week's appt.

## 2019-09-12 ENCOUNTER — Ambulatory Visit: Payer: Self-pay | Admitting: Family Medicine

## 2020-03-06 NOTE — Progress Notes (Signed)
Yasha Tibbett T. Sabine Tenenbaum, MD, [place]  [facility] and [place] at [facility] [place], [address]  Phone: [phone]  FAX: [phone]  Marie Lester - 39 y.o. female  MRN [id number]  Date of Birth: April 04, 1981  Date: 03/07/2020  PCP: Ria Bush, MD  Referral: Ria Bush, MD  Chief Complaint  Patient presents with  . Knee Pain    Right    This visit occurred during the SARS-CoV-2 public health emergency.  Safety protocols were in place, including screening questions prior to the visit, additional usage of staff PPE, and extensive cleaning of exam room while observing appropriate contact time as indicated for disinfecting solutions.   Subjective:   Marie Lester is a 39 y.o. very pleasant female patient with Body mass index is 22.67 kg/m. who presents with the following:  She is a pleasant young lady who presents with acute right knee pain: She has been having some intermittent pain in her right knee off and on for about a year, but she has had some exacerbation a few weeks ago and got worse when she kicked the soccer ball a few days ago.  This was not much and she only did this while waiting for her children.  She does not have a specific injury that she can recall.  She does not have a significant prior knee history including no fractures and no operative intervention.  Previous to this year she has been able to run very easily 3 to 5 miles, but now she cannot do this.  Went to [facility], ? Pes bursitis was their diagnosis. Will hurt in different places.  Now back of her knee will hurt. Now in the back.  Light jog a fe weeks ago.  Soccer ball a few days ago.  Weights does not hurt.    Extension will help at the knee.  She has been able to work out  Has been going on for 1 year.  Review of Systems is noted in the HPI, as appropriate   Objective:   BP 110/64   Pulse 75   Temp 98.6  F (37 C) (Temporal)   Ht 5\' 7"  (1.702 m)   Wt 144 lb 12 oz (65.7 kg)   LMP 02/22/2020   SpO2 98%   BMI 22.67 kg/m    GEN: No acute distress; alert,appropriate. PULM: Breathing comfortably in no respiratory distress PSYCH: Normally interactive.    Full extension and flexion to 125.  Stable to varus and valgus stress.  ACL and PCL are intact.  She does not have pain at the pes bursa.  Minimal pain with loading the medial and lateral patellar facets.  Bounce home testing and flexion pinch are nontender.  She does have some pain with McMurray's.  She also has some pain with the medial joint line, but this is relatively mild.  Strength testing is nontender in its entirety.  Apley's grind and Dial testing are also negative.  Radiology:   Assessment and Plan:     ICD-10-CM   1. Medial knee pain, right  M25.561   2. Acute pain of right knee  M25.561 methylPREDNISolone acetate (DEPO-MEDROL) injection 80 mg   Medial knee pain x1 year with exacerbation this week.  Ligaments are stable, she may have a meniscal tear based on exam, but her knee pain is limiting her ability to exercise at maximal function.  For now, follow conservative algorithms and do a trial of  corticosteroid injection.  I think that at 1 year of symptoms an MRI of her knee would also be reasonable for evaluation of additional internal derangement.  Aspiration/Injection Procedure Note Jeffrey Voth Dec 02, 1980 Date of procedure: 03/07/2020  Procedure: Large Joint Aspiration / Injection of Knee, R Indications: Pain  Procedure Details Patient verbally consented to procedure. Risks (including potential rare risk of infection), benefits, and alternatives explained. Sterilely prepped with Chloraprep. Ethyl cholride used for anesthesia. 8 cc Lidocaine 1% mixed with 2 mL Depo-Medrol 40 mg injected using the anteromedial approach without difficulty. No complications with procedure and tolerated well. Patient had decreased pain  post-injection. Medication: 2 mL of Depo-Medrol 40 mg, equaling Depo-Medrol 80 mg total   Meds ordered this encounter  Medications  . methylPREDNISolone acetate (DEPO-MEDROL) injection 80 mg   Medications Discontinued During This Encounter  Medication Reason  . cephALEXin (KEFLEX) 500 MG capsule Completed Course   No orders of the defined types were placed in this encounter.   Follow-up: No follow-ups on file.  Signed,  Maud Deed. Sylvan Sookdeo, MD   Outpatient Encounter Medications as of 03/07/2020  Medication Sig  . tretinoin (RETIN-A) 0.05 % cream tretinoin 0.05 % topical cream  APPLY A PEA SIZED AMOUNT TO FACE NIGHTLY  . [DISCONTINUED] cephALEXin (KEFLEX) 500 MG capsule Take 1 capsule (500 mg total) by mouth 2 (two) times daily.  . [EXPIRED] methylPREDNISolone acetate (DEPO-MEDROL) injection 80 mg    No facility-administered encounter medications on file as of 03/07/2020.

## 2020-03-07 ENCOUNTER — Encounter: Payer: Self-pay | Admitting: Family Medicine

## 2020-03-07 ENCOUNTER — Other Ambulatory Visit: Payer: Self-pay

## 2020-03-07 ENCOUNTER — Ambulatory Visit (INDEPENDENT_AMBULATORY_CARE_PROVIDER_SITE_OTHER): Payer: 59 | Admitting: Family Medicine

## 2020-03-07 VITALS — BP 110/64 | HR 75 | Temp 98.6°F | Ht 67.0 in | Wt 144.8 lb

## 2020-03-07 DIAGNOSIS — M25561 Pain in right knee: Secondary | ICD-10-CM | POA: Diagnosis not present

## 2020-03-07 MED ORDER — METHYLPREDNISOLONE ACETATE 40 MG/ML IJ SUSP
80.0000 mg | Freq: Once | INTRAMUSCULAR | Status: AC
Start: 2020-03-07 — End: 2020-03-07
  Administered 2020-03-07: 80 mg via INTRA_ARTICULAR

## 2020-04-18 ENCOUNTER — Ambulatory Visit: Payer: 59 | Admitting: Family Medicine

## 2020-04-25 ENCOUNTER — Telehealth: Payer: Self-pay | Admitting: Family Medicine

## 2020-04-25 ENCOUNTER — Encounter: Payer: Self-pay | Admitting: Family Medicine

## 2020-04-25 ENCOUNTER — Telehealth (INDEPENDENT_AMBULATORY_CARE_PROVIDER_SITE_OTHER): Payer: 59 | Admitting: Family Medicine

## 2020-04-25 VITALS — HR 75 | Temp 98.5°F | Ht 67.0 in | Wt 140.0 lb

## 2020-04-25 DIAGNOSIS — N3000 Acute cystitis without hematuria: Secondary | ICD-10-CM

## 2020-04-25 DIAGNOSIS — R0981 Nasal congestion: Secondary | ICD-10-CM

## 2020-04-25 DIAGNOSIS — R202 Paresthesia of skin: Secondary | ICD-10-CM

## 2020-04-25 DIAGNOSIS — R109 Unspecified abdominal pain: Secondary | ICD-10-CM

## 2020-04-25 DIAGNOSIS — R6883 Chills (without fever): Secondary | ICD-10-CM

## 2020-04-25 DIAGNOSIS — R110 Nausea: Secondary | ICD-10-CM

## 2020-04-25 LAB — POC URINALSYSI DIPSTICK (AUTOMATED)
Bilirubin, UA: NEGATIVE
Blood, UA: NEGATIVE
Glucose, UA: NEGATIVE
Ketones, UA: NEGATIVE
Leukocytes, UA: NEGATIVE
Nitrite, UA: NEGATIVE
Protein, UA: NEGATIVE
Spec Grav, UA: 1.01 (ref 1.010–1.025)
Urobilinogen, UA: 0.2 E.U./dL
pH, UA: 6.5 (ref 5.0–8.0)

## 2020-04-25 NOTE — Progress Notes (Signed)
Patient ID: Marie Lester, female    DOB: [birth year], 39 y.o.   MRN: [id number]  Virtual visit completed through [place], a video enabled telemedicine application. Due to national recommendations of social distancing due to COVID-19, a virtual visit is felt to be most appropriate for this patient at this time. Reviewed limitations, risks, security and privacy concerns of performing a virtual visit and the availability of in person appointments. I also reviewed that there may be a patient responsible charge related to this service. The patient agreed to proceed.   Patient location: home Provider location: [place] [facility], office Persons participating in this virtual visit: patient, provider   If any vitals were documented, they were collected by patient at home unless specified below.    Pulse 75   Temp 98.5 F (36.9 C)   Ht 5\' 7"  (1.702 m)   Wt 140 lb (63.5 kg)   LMP 04/16/2020   BMI 21.93 kg/m    CC: ?UTI Subjective:   HPI: Marie Lester is a 39 y.o. female presenting on 04/25/2020 for Abdominal Pain (C/o abd pressure after urination and nausea.  Seen at [facility] 04/22/20, dx UTI.  Given Macrobid x5 days.  Also, c/o nasal congestion and drainage.  Had neg rapid COVID test.  Took Nyquil. )   Recent UTI seen at next care treated with 5d macrobid course. Overall improving but she has persistent pressure after voiding. Dysuria, urgency, frequency. No hematuria, flank pain. Staying well hydrated. Told macrobid should cover UTI.   2d ago started having some nasal congestion - clear to yellow discharge. ST with PNdrainage. Mildly productive cough. Took dayquil - woke up in the middle of the night with chills, whole body paresthesias, nausea with dry heaves.   She had negative rapid COVID swab this morning.   Looser stools recently.  No abd pain, HA, ear pain or tooth pain, dyspnea or wheezing.  No new skin rash.  No recent tick bites.  Oldest son with mild cough.   Husband's mother  passed away from [place].  Many deaths in friends, family.  Increased stress recently.       Relevant past medical, surgical, family and social history reviewed and updated as indicated. Interim medical history since our last visit reviewed. Allergies and medications reviewed and updated. Outpatient Medications Prior to Visit  Medication Sig Dispense Refill  . nitrofurantoin, macrocrystal-monohydrate, (MACROBID) 100 MG capsule Take 100 mg by mouth 2 (two) times daily.    Marland Kitchen tretinoin (RETIN-A) 0.05 % cream tretinoin 0.05 % topical cream  APPLY A PEA SIZED AMOUNT TO FACE NIGHTLY     No facility-administered medications prior to visit.     Per HPI unless specifically indicated in ROS section below Review of Systems Objective:  Pulse 75   Temp 98.5 F (36.9 C)   Ht 5\' 7"  (1.702 m)   Wt 140 lb (63.5 kg)   LMP 04/16/2020   BMI 21.93 kg/m   Wt Readings from Last 3 Encounters:  04/25/20 140 lb (63.5 kg)  03/07/20 144 lb 12 oz (65.7 kg)  07/14/19 146 lb 9 oz (66.5 kg)       Physical exam: Gen: alert, NAD, not ill appearing Pulm: speaks in complete sentences without increased work of breathing Psych: normal mood, normal thought content      Results for orders placed or performed in visit on 04/25/20  POCT Urinalysis Dipstick (Automated)  Result Value Ref Range   Color, UA light yellow  Clarity, UA clear    Glucose, UA Negative Negative   Bilirubin, UA negative    Ketones, UA negative    Spec Grav, UA 1.010 1.010 - 1.025   Blood, UA negative    pH, UA 6.5 5.0 - 8.0   Protein, UA Negative Negative   Urobilinogen, UA 0.2 0.2 or 1.0 E.U./dL   Nitrite, UA negative    Leukocytes, UA Negative Negative   Assessment & Plan:   Problem List Items Addressed This Visit    UTI (urinary tract infection) - Primary    Recent UTI treated at [facility] with 5d macrobid course. UA today normal however given ongoing abd pressure with voiding will send UCx.  Other recent symptoms may be  due to side effect to macrobid vs new viral respiratory infection vs related to increased stress (multiple deaths in family/friend circle).  Supportive care reviewed. Advised monitor symptoms, let us know if not improving each day.       Relevant Medications   nitrofurantoin, macrocrystal-monohydrate, (MACROBID) 100 MG capsule   Other Relevant Orders   Urine Culture    Other Visit Diagnoses    Abdominal pressure       Relevant Orders   POCT Urinalysis Dipstick (Automated) (Completed)   Paresthesia       Nasal congestion       Chills       Nausea           No orders of the defined types were placed in this encounter.  Orders Placed This Encounter  Procedures  . Urine Culture  . POCT Urinalysis Dipstick (Automated)    I discussed the assessment and treatment plan with the patient. The patient was provided an opportunity to ask questions and all were answered. The patient agreed with the plan and demonstrated an understanding of the instructions. The patient was advised to call back or seek an in-person evaluation if the symptoms worsen or if the condition fails to improve as anticipated.  Follow up plan: Return if symptoms worsen or fail to improve.  Ria Bush, MD

## 2020-04-25 NOTE — Telephone Encounter (Signed)
Lvm asking pt to call back.   Need to see if pt can do a MyChart visit today at 4:30 with him.  If so, she'll need to be added on and will need someone to drop off a urine sample in the specimen bucket in foyer before the video visit.

## 2020-04-25 NOTE — Telephone Encounter (Signed)
Pt called in went to the urgent care for a uti and they prescribed macrobid and only a 5 day supply and she is on day 4, and woke up nausea and woke up tingling cold chills and sweat, no fever, and took a rapid covid test -NEG, and still have pressure when she urinates.

## 2020-04-25 NOTE — Telephone Encounter (Signed)
[facility] - Client TELEPHONE ADVICE RECORD [facility] Patient Name: Marie Lester Gender: Female DOB: June 15, 1980 Age: 39 Y 49 M 3 D Return Phone Number: [phone] (Primary) Address: City/State/Zip: [place] [address] Client [facility] - Client Client Site [place] - Day Physician Ria Bush - MD Contact Type Call Who Is Calling Patient / Member / Family / Caregiver Call Type Triage / Clinical Relationship To Patient Self Return Phone Number [phone] (Primary) Chief Complaint Nausea Reason for Call Symptomatic / Request for Health Information Initial Comment lolamae voisin [birth year] [phone] from [place], seen at [facility] for uti, still having symptoms, nausea chills and sweats, neg covid, pressure, took dayquil, which is something she doesn't normally take so she doesn't know if it could have caused the chills? has been drinking a lot of water. macorbid, day 4, taken this morning Translation No Nurse Assessment Nurse: Clovis Riley, RN, Georgina Peer Date/Time (Eastern Time): 04/25/2020 9:47:53 AM Confirm and document reason for call. If symptomatic, describe symptoms. ---Caller states she was seen on sunday in urgent care for UTI. She is having vaginal pressure. Caller states she started with a cold on monday and yesterday she took dayquil. Covid was negative. Drinking lots of water and urine is clear. State she feels tingling in her arms. No headache. Does the patient have any new or worsening symptoms? ---Yes Will a triage be completed? ---Yes Related visit to physician within the last 2 weeks? ---Yes Does the PT have any chronic conditions? (i.e. diabetes, asthma, this includes High risk factors for pregnancy, etc.) ---No Is the patient pregnant or possibly pregnant? (Ask all females between the ages of 43-55) ---No Is this a behavioral health or substance abuse call?  ---No Guidelines Guideline Title Affirmed Question Affirmed Notes Nurse Date/Time (Eastern Time) Urinary Tract Infection on Antibiotic Follow-up Call - Female [1] Taking antibiotic < 72 hours (3 days) for UTI AND [2] painful urination or frequency not improved Clovis Riley, RN, Georgina Peer 04/25/2020 9:51:24 AM PLEASE NOTE: All timestamps contained within this report are represented as [place] Standard Time. CONFIDENTIALTY NOTICE: This fax transmission is intended only for the addressee. It contains information that is legally privileged, confidential or otherwise protected from use or disclosure. If you are not the intended recipient, you are strictly prohibited from reviewing, disclosing, copying using or disseminating any of this information or taking any action in reliance on or regarding this information. If you have received this fax in error, please notify us immediately by telephone so that we can arrange for its return to [place]. Phone: [phone], Toll-Free: [phone], Fax: [phone] Page: 2 of 2 Call Id: [id number] Guidelines Guideline Title Affirmed Question Affirmed Notes Nurse Date/Time Eilene Ghazi Time) Nausea Taking prescription medication that could cause nausea (e.g., narcotics/opiates, antibiotics, OCPs, many others) Clovis Riley, RN, Georgina Peer 04/25/2020 9:53:53 AM Disp. Time Eilene Ghazi Time) Disposition Final User 04/25/2020 9:53:36 [place], RN, Georgina Peer 04/25/2020 9:56:08 AM Call PCP within 24 Hours Yes Clovis Riley, RN, Leilani Merl Disagree/Comply Comply Caller Understands Yes PreDisposition Call Doctor Care Advice Given Per Guideline HOME CARE: * You should be able to treat this at home. REASSURANCE AND EDUCATION: * Most bacterial infections begin responding to antibiotics over a 1-3 day period. * The fever should disappear within 24 hours of starting the antibiotic. * Urinary symptoms and pain should decrease during the 24-72 hour period after starting the antibiotic.  DRINK EXTRA FLUIDS: * Drink 8 to 10 cups (1,800 to 2,400 ml) of  liquids a day. * Drink extra fluids. * Reason: This will water-down your urine and make it less painful to pass. If there is an infection, this will help wash out the germs from your bladder. CARE ADVICE given per Urinary Tract Infection on Antibiotic Follow-Up Call, Female (Adult) guideline. CALL BACK IF: * Fever lasts over 24 hours on antibiotics * Pain does not improve by day 4 on antibiotics * Urine symptoms do not improve by day 4 on antibiotics * You become worse CALL PCP WITHIN 24 HOURS: * You need to discuss this with your doctor (or NP/PA) within the next 24 hours. * IF OFFICE WILL BE OPEN: Call the office when it opens tomorrow morning. * IF OFFICE WILL BE CLOSED: I'll page the on-call provider now. EXCEPTION: from 9 pm to 9 am. Since this isn't urgent, we'll hold the page until morning. CLEAR FLUIDS: * Take clear fluids in small amounts until the nausea is resolved for 8 hours: * Sip water or rehydration liquid (Gatorade or Powerade) * Other options: 1/2 strength flat lemon-lime soda or ginger ale. AVOID MEDS: * Stop taking all non-prescription medicines. (Reason: may make nausea worse.) * Avoid NSAIDs, which can cause gastritis * Call if vomiting a prescription medicine. CALL BACK IF: * You become worse CARE ADVICE given per Nausea (Adult) guideline.

## 2020-04-25 NOTE — Assessment & Plan Note (Addendum)
Recent UTI treated at [facility] with 5d macrobid course. UA today normal however given ongoing abd pressure with voiding will send UCx.  Other recent symptoms may be due to side effect to macrobid vs new viral respiratory infection vs related to increased stress (multiple deaths in family/friend circle).  Supportive care reviewed. Advised monitor symptoms, let us know if not improving each day.

## 2020-04-25 NOTE — Telephone Encounter (Signed)
Can we schedule virtual for 4:30pm and have her ask someone to drop off sterile urine for her to run UA and likely culture for ongoing symptoms?

## 2020-04-25 NOTE — Telephone Encounter (Signed)
Pt scheduled video visit today at 4:30.

## 2020-04-26 ENCOUNTER — Ambulatory Visit: Payer: 59 | Admitting: Internal Medicine

## 2020-04-26 LAB — URINE CULTURE
MICRO NUMBER:: 11215606
Result:: NO GROWTH
SPECIMEN QUALITY:: ADEQUATE

## 2020-05-29 ENCOUNTER — Encounter: Payer: Self-pay | Admitting: Family Medicine

## 2020-05-29 DIAGNOSIS — J358 Other chronic diseases of tonsils and adenoids: Secondary | ICD-10-CM

## 2020-05-30 ENCOUNTER — Telehealth: Payer: 59 | Admitting: Family Medicine

## 2020-05-31 NOTE — Telephone Encounter (Signed)
Spoke with Dr Darnell Level and patient. I called [facility], [facility], Dr Pollie Friar office, several [facility] offices and [facility] and no one is able to work patient in due to closing early or not having enough staff.  I was able to get patient in with Dr Benjamine Mola ENT In [place] at 1 pm today. Patient and Dr Darnell Level agree with this plan.

## 2020-05-31 NOTE — Addendum Note (Signed)
Addended by: Ria Bush on: 05/31/2020 09:49 AM   Modules accepted: Orders

## 2020-05-31 NOTE — Telephone Encounter (Addendum)
Spoke with patient. Does not look like tonsillar stone.  No fever. Some congestion last week. No trismus, hoarseness.  Will see if we can expedite ENT eval for peritonsillar mass r/o abscess.  STAT referral placed.

## 2020-11-21 ENCOUNTER — Telehealth: Payer: 59 | Admitting: Family

## 2020-11-21 ENCOUNTER — Encounter: Payer: Self-pay | Admitting: Family Medicine

## 2020-11-21 DIAGNOSIS — R399 Unspecified symptoms and signs involving the genitourinary system: Secondary | ICD-10-CM | POA: Diagnosis not present

## 2020-11-21 MED ORDER — CEPHALEXIN 500 MG PO CAPS: 500.0000 mg | ORAL_CAPSULE | Freq: Two times a day (BID) | ORAL | 0 refills | Status: DC

## 2020-11-21 NOTE — Progress Notes (Signed)

## 2021-01-15 ENCOUNTER — Other Ambulatory Visit: Payer: Self-pay | Admitting: Obstetrics and Gynecology

## 2021-01-15 ENCOUNTER — Encounter: Payer: Self-pay | Admitting: Family Medicine

## 2021-01-15 DIAGNOSIS — Z1231 Encounter for screening mammogram for malignant neoplasm of breast: Secondary | ICD-10-CM

## 2021-01-15 LAB — CBC AND DIFFERENTIAL
Hemoglobin: 13.4 (ref 12.0–16.0)
Platelets: 227 (ref 150–399)
WBC: 4.4

## 2021-01-15 NOTE — Telephone Encounter (Signed)
Printed lab results.  Placed in Dr. Synthia Innocent box.

## 2021-01-16 ENCOUNTER — Other Ambulatory Visit: Payer: Self-pay | Admitting: Obstetrics and Gynecology

## 2021-01-16 ENCOUNTER — Ambulatory Visit
Admission: RE | Admit: 2021-01-16 | Discharge: 2021-01-16 | Disposition: A | Discharge status: Home / Self Care | Payer: 59 | Source: Ambulatory Visit | Attending: Obstetrics and Gynecology | Admitting: Obstetrics and Gynecology

## 2021-01-16 ENCOUNTER — Other Ambulatory Visit: Payer: Self-pay

## 2021-01-16 DIAGNOSIS — Z1231 Encounter for screening mammogram for malignant neoplasm of breast: Secondary | ICD-10-CM

## 2021-01-17 ENCOUNTER — Encounter: Payer: Self-pay | Admitting: Family Medicine

## 2021-01-28 ENCOUNTER — Encounter: Payer: Self-pay | Admitting: Family Medicine

## 2021-01-28 ENCOUNTER — Other Ambulatory Visit: Payer: Self-pay

## 2021-01-28 ENCOUNTER — Ambulatory Visit (INDEPENDENT_AMBULATORY_CARE_PROVIDER_SITE_OTHER): Payer: 59 | Admitting: Family Medicine

## 2021-01-28 VITALS — BP 100/60 | HR 75 | Temp 98.3°F | Ht 67.0 in | Wt 145.5 lb

## 2021-01-28 DIAGNOSIS — M25561 Pain in right knee: Secondary | ICD-10-CM

## 2021-01-28 DIAGNOSIS — Z91030 Bee allergy status: Secondary | ICD-10-CM | POA: Diagnosis not present

## 2021-01-28 MED ORDER — EPINEPHRINE 0.3 MG/0.3ML IJ SOAJ: 0.3000 mg | INTRAMUSCULAR | 1 refills | Status: AC | PRN

## 2021-01-28 MED ORDER — TRIAMCINOLONE ACETONIDE 40 MG/ML IJ SUSP
40.0000 mg | Freq: Once | INTRAMUSCULAR | Status: AC
  Administered 2021-01-28: 40 mg via INTRA_ARTICULAR

## 2021-01-28 MED ORDER — PREDNISONE 20 MG PO TABS: ORAL_TABLET | ORAL | 0 refills | Status: DC

## 2021-01-28 NOTE — Progress Notes (Signed)
Abigial Newville T. Ritchard Paragas, MD, [place] [facility] [place], [address]  Phone: [phone]  FAX: [phone]  Marie Lester - 40 y.o. female  MRN [id number]  Date of Birth: 1980-09-20  Date: 01/28/2021  PCP: Ria Bush, MD  Referral: Ria Bush, MD  Chief Complaint  Patient presents with   Knee Pain    Right-Wants injection    This visit occurred during the SARS-CoV-2 public health emergency.  Safety protocols were in place, including screening questions prior to the visit, additional usage of staff PPE, and extensive cleaning of exam room while observing appropriate contact time as indicated for disinfecting solutions.   Subjective:   Marie Lester is a 40 y.o. very pleasant female patient with Body mass index is 22.79 kg/m. who presents with the following:  60 mile backpacking trip.  Ran some off and on.  In June tweaked, and will be going on for about 6 days.  I did not see her before about a year ago, at that point I was worried she has internal derangement, but ultimately she did okay and has been able to be quite active.  She has had some recurrence of some pain recently.  This is not terrible, but with a 60 mile backpacking trip, she has some concern that [place].  She also has a bee sting allergy with what sounds like anaphylaxis.  Previously when this is happened she has taken some high-dose Benadryl, and as a child she does not remember having to go to see a doctor for an injection.  Medial knee, does feel better compared to last year.   Review of Systems is noted in the HPI, as appropriate   Objective:   BP 100/60   Pulse 75   Temp 98.3 F (36.8 C) (Temporal)   Ht '5\' 7"'$  (1.702 m)   Wt 145 lb 8 oz (66 kg)   LMP 01/25/2021   SpO2 99%   BMI 22.79 kg/m   GEN: No acute distress; alert,appropriate. PULM: Breathing comfortably in no respiratory  distress PSYCH: Normally interactive.    Right knee: Full extension and flexion to 135.  Mild pain with loading the medial lateral patellar facets with positive grind maneuver.  Stable to varus and valgus stress.  She does have some mild medial joint line tenderness.  ACL and PCL are intact.  With deep McMurray's she does have some pain.  Flexion pinch without McMurray's does not cause significant pain, and her bounce home testing is normal.  No tenderness throughout the bony anatomy at the knee.  Radiology:  Assessment and Plan:     ICD-10-CM   1. Acute pain of right knee  M25.561 triamcinolone acetonide (KENALOG-40) injection 40 mg    2. Bee allergy status  Z91.030      With a bee sting allergy and 60 mile track without cell phone and not near roads, I am going to give her EpiPen to keep with her.  Also recommended that she keep Benadryl and I gave her some prednisone to take with her on her backpacking trip as well.  Does have some acute knee pain with exacerbation.  She has had pretty good range of motion, and able to induce some pain with provocation.  Given above, I think it is reasonable to do a therapeutic injection today to see if we can get her excellent pain relief prior to her trip.  Aspiration/Injection Procedure  Note Marie Lester 08/02/1980 Date of procedure: 01/28/2021  Procedure: Large Joint Aspiration / Injection of Knee, R Indications: Pain  Procedure Details Patient verbally consented to procedure. Risks, benefits, and alternatives explained. Sterilely prepped with Chloraprep. Ethyl cholride used for anesthesia. 9 cc Lidocaine 1% mixed with 1 mL of Kenalog 40 mg injected using the anteromedial approach without difficulty. No complications with procedure and tolerated well. Patient had decreased pain post-injection. Medication: 1 mL of Kenalog 40 mg   Meds ordered this encounter  Medications   predniSONE (DELTASONE) 20 MG tablet    Sig: 2 tabs po for 4 days, then 1  tab po for 4 days    Dispense:  12 tablet    Refill:  0   EPINEPHrine 0.3 mg/0.3 mL IJ SOAJ injection    Sig: Inject 0.3 mg into the muscle as needed for anaphylaxis.    Dispense:  2 mL    Refill:  1   triamcinolone acetonide (KENALOG-40) injection 40 mg   Medications Discontinued During This Encounter  Medication Reason   cephALEXin (KEFLEX) 500 MG capsule Completed Course   nitrofurantoin, macrocrystal-monohydrate, (MACROBID) 100 MG capsule Completed Course   No orders of the defined types were placed in this encounter.   Follow-up: No follow-ups on file.  [employer] One speech-to-text software was used for transcription in this dictation.  Possible transcriptional errors can occur using Editor, commissioning.   Signed,  Maud Deed. Jalayiah Bibian, MD   Outpatient Encounter Medications as of 01/28/2021  Medication Sig   ALTRENO 0.05 % LOTN Apply topically.   EPINEPHrine 0.3 mg/0.3 mL IJ SOAJ injection Inject 0.3 mg into the muscle as needed for anaphylaxis.   predniSONE (DELTASONE) 20 MG tablet 2 tabs po for 4 days, then 1 tab po for 4 days   tretinoin (RETIN-A) 0.05 % cream tretinoin 0.05 % topical cream  APPLY A PEA SIZED AMOUNT TO FACE NIGHTLY   [DISCONTINUED] cephALEXin (KEFLEX) 500 MG capsule Take 1 capsule (500 mg total) by mouth 2 (two) times daily.   [DISCONTINUED] nitrofurantoin, macrocrystal-monohydrate, (MACROBID) 100 MG capsule Take 100 mg by mouth 2 (two) times daily.   [EXPIRED] triamcinolone acetonide (KENALOG-40) injection 40 mg    No facility-administered encounter medications on file as of 01/28/2021.

## 2021-05-29 ENCOUNTER — Other Ambulatory Visit: Payer: Self-pay | Admitting: Nurse Practitioner

## 2021-05-29 DIAGNOSIS — J40 Bronchitis, not specified as acute or chronic: Secondary | ICD-10-CM

## 2021-05-29 MED ORDER — PREDNISONE 10 MG PO TABS: 20.0000 mg | ORAL_TABLET | Freq: Two times a day (BID) | ORAL | 0 refills | Status: AC

## 2021-05-29 MED ORDER — FLUTICASONE PROPIONATE 50 MCG/ACT NA SUSP
2.0000 | Freq: Every day | NASAL | 6 refills | Status: DC
Start: 2021-05-29 — End: 2023-05-29

## 2021-11-07 DIAGNOSIS — G43829 Menstrual migraine, not intractable, without status migrainosus: Secondary | ICD-10-CM | POA: Diagnosis not present

## 2021-11-07 DIAGNOSIS — R102 Pelvic and perineal pain: Secondary | ICD-10-CM | POA: Diagnosis not present

## 2021-11-07 DIAGNOSIS — N921 Excessive and frequent menstruation with irregular cycle: Secondary | ICD-10-CM | POA: Diagnosis not present

## 2021-11-24 IMAGING — MG DIGITAL SCREENING BREAST BILAT IMPLANT W/ TOMO W/ CAD
8 of 14 series · 8 of 34 positions shown · non-contrast
Comparison: Previous exam(s).

CLINICAL DATA: Screening.

EXAM:
DIGITAL SCREENING BILATERAL MAMMOGRAM WITH IMPLANTS, CAD AND
TOMOSYNTHESIS
TECHNIQUE: Bilateral screening digital craniocaudal and mediolateral oblique
mammograms were obtained. Bilateral screening digital breast
tomosynthesis was performed. The images were evaluated with
computer-aided detection. Standard and/or implant displaced views
were performed.

[R MLO]
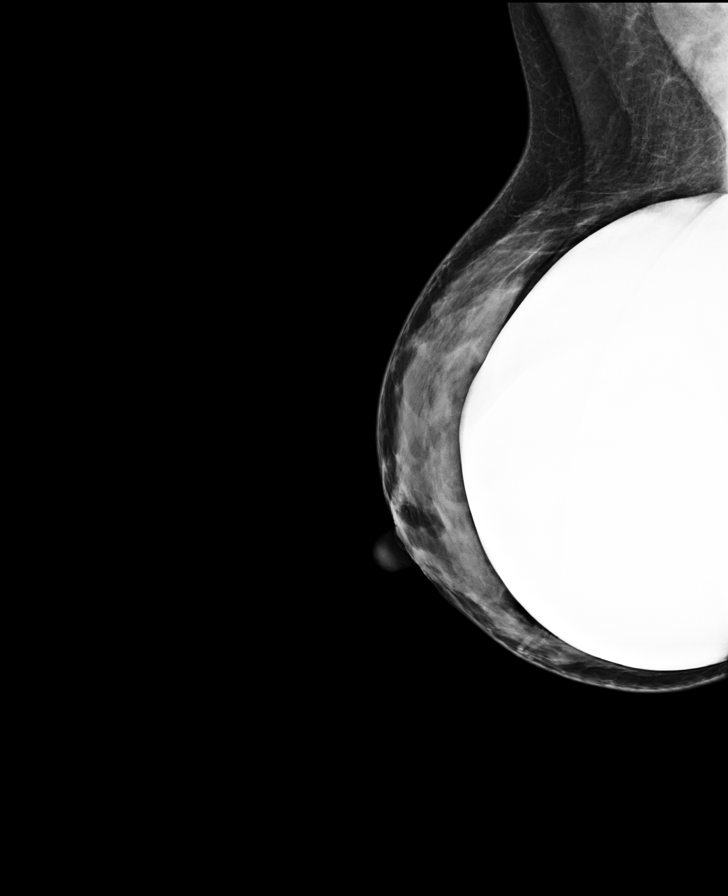

[L MLO]
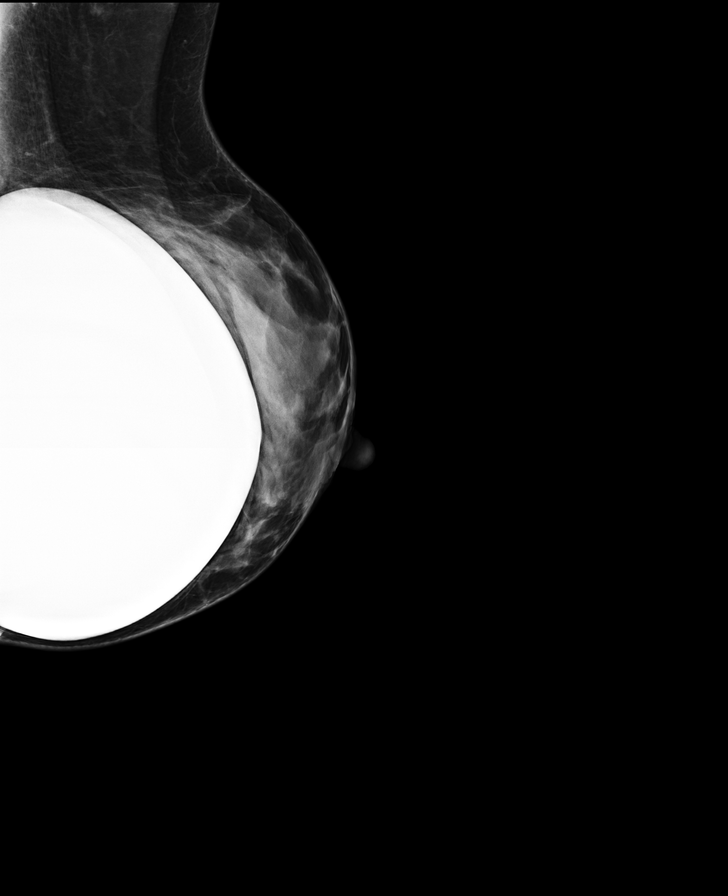

[R CC]
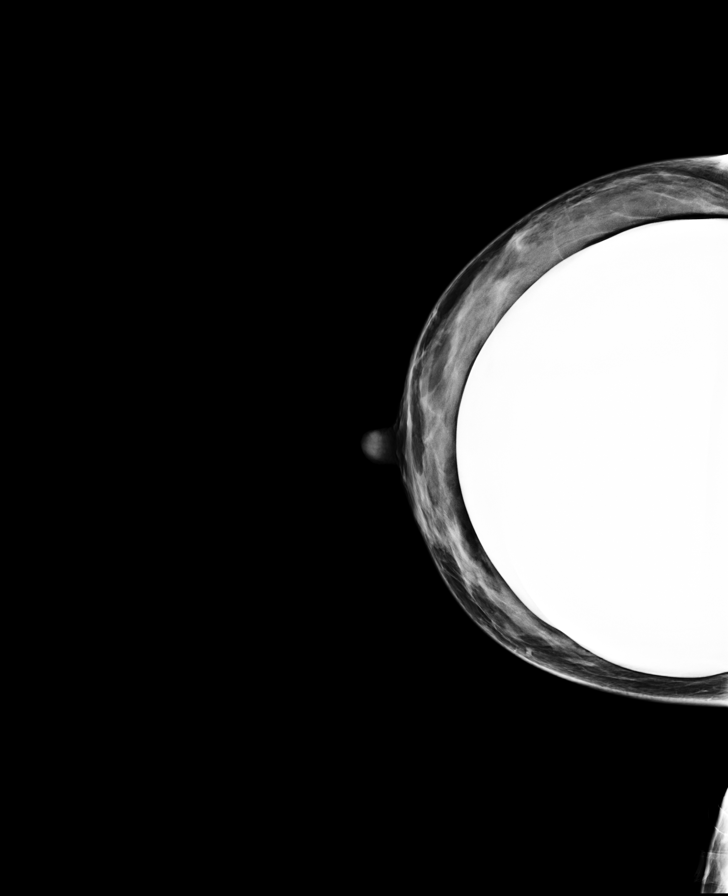

[L CC]
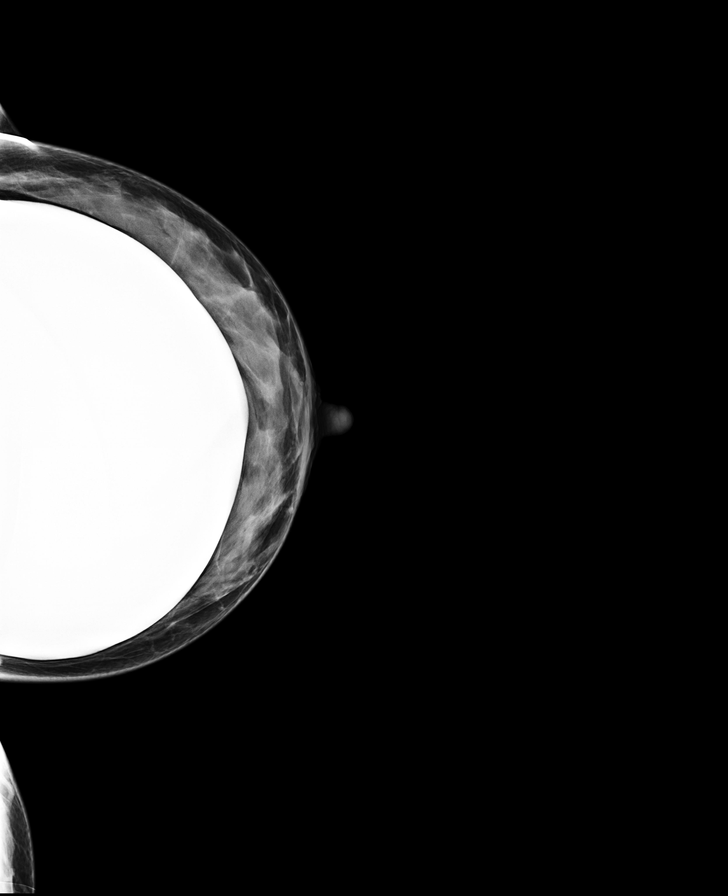

[L CC synth-2D]
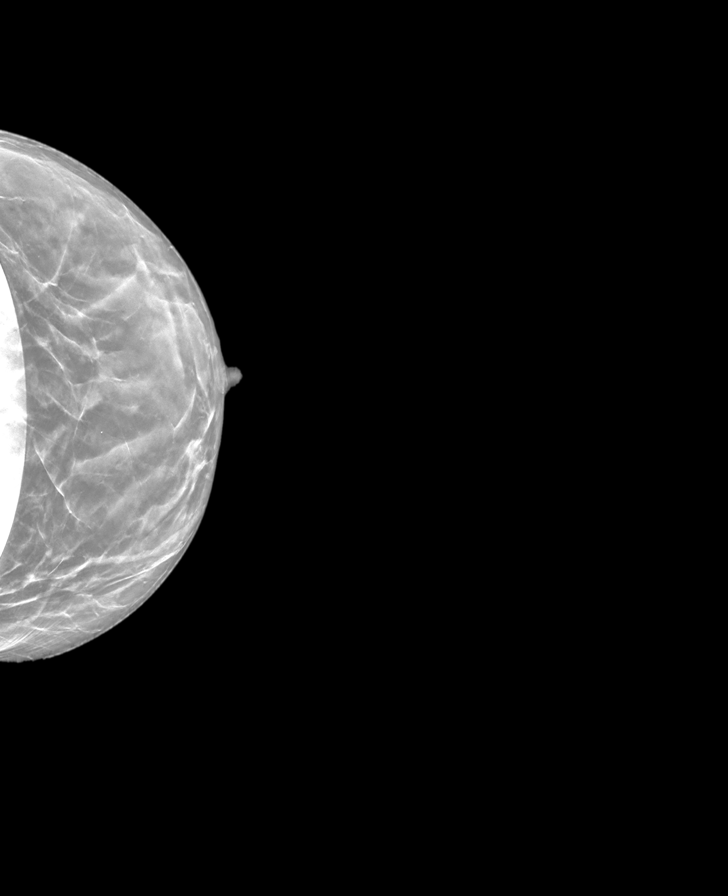

[L MLO synth-2D (1 of 2)]
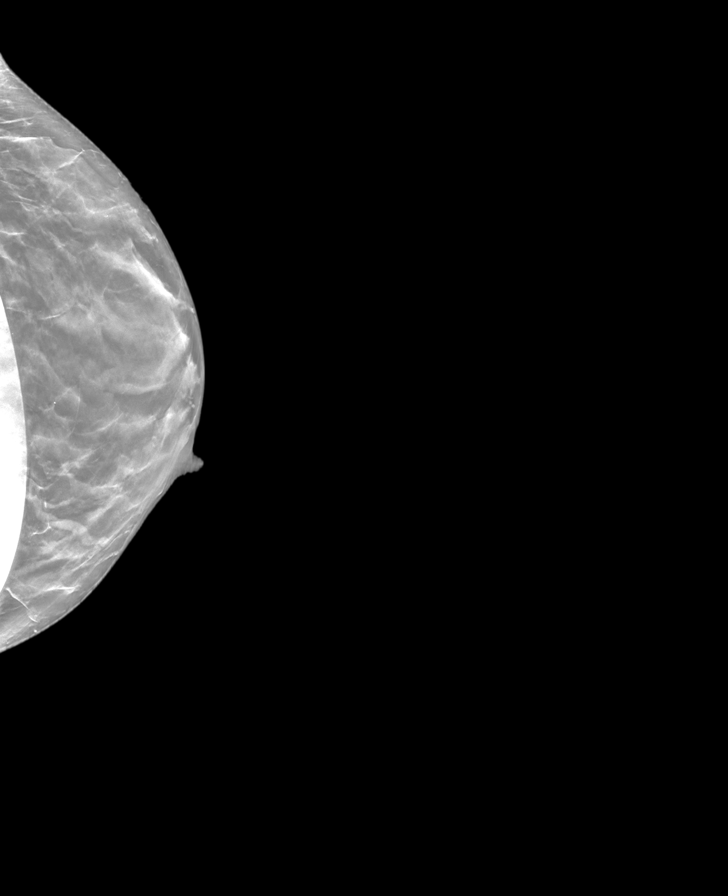

[L MLO synth-2D (2 of 2)]
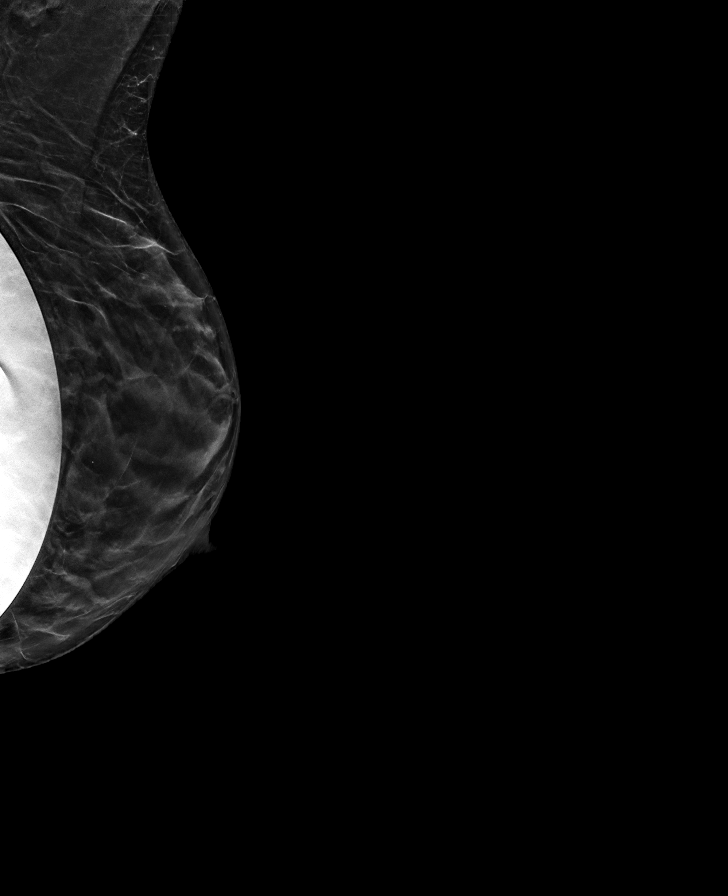

[R MLO synth-2D]
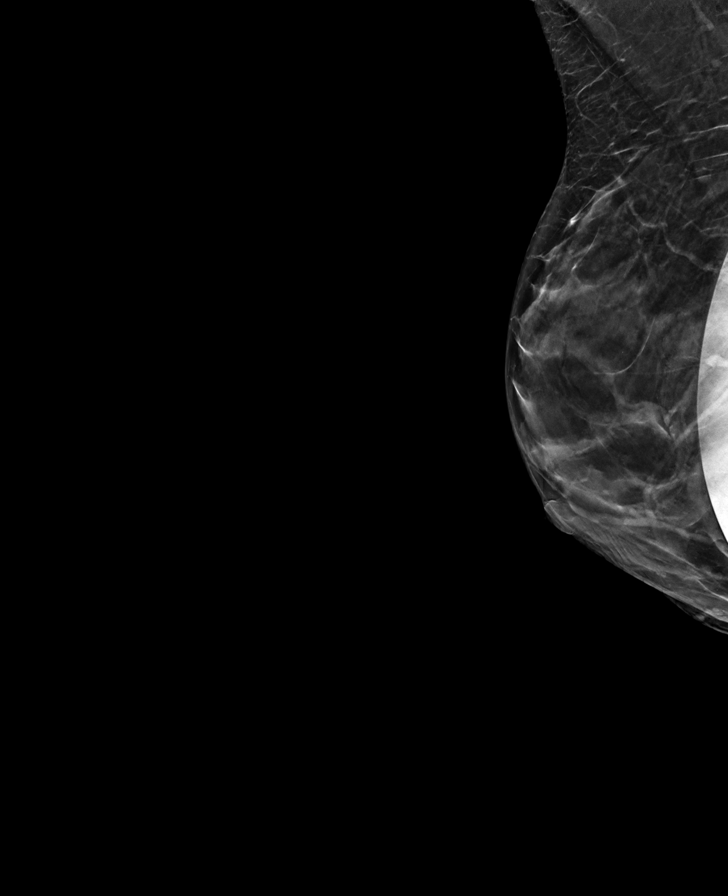

[8 of 34 positions shown; findings below may reference images not displayed]

ACR Breast Density Category c: The breast tissue is heterogeneously
dense, which may obscure small masses.
FINDINGS: The patient has retropectoral implants. There are no findings
suspicious for malignancy.
IMPRESSION: No mammographic evidence of malignancy. A result letter of this
screening mammogram will be mailed directly to the patient.

RECOMMENDATION:
Screening mammogram in one year. (Code:LT-E-7TH)

BI-RADS CATEGORY  1:  Negative.

## 2022-02-19 DIAGNOSIS — Z01419 Encounter for gynecological examination (general) (routine) without abnormal findings: Secondary | ICD-10-CM | POA: Diagnosis not present

## 2022-02-19 DIAGNOSIS — R232 Flushing: Secondary | ICD-10-CM | POA: Diagnosis not present

## 2022-02-26 DIAGNOSIS — G43829 Menstrual migraine, not intractable, without status migrainosus: Secondary | ICD-10-CM | POA: Diagnosis not present

## 2022-02-26 DIAGNOSIS — Z1231 Encounter for screening mammogram for malignant neoplasm of breast: Secondary | ICD-10-CM | POA: Diagnosis not present

## 2022-02-26 DIAGNOSIS — Z01411 Encounter for gynecological examination (general) (routine) with abnormal findings: Secondary | ICD-10-CM | POA: Diagnosis not present

## 2022-08-01 ENCOUNTER — Other Ambulatory Visit: Payer: Self-pay

## 2022-08-01 DIAGNOSIS — Z1231 Encounter for screening mammogram for malignant neoplasm of breast: Secondary | ICD-10-CM

## 2022-08-07 DIAGNOSIS — N63 Unspecified lump in unspecified breast: Secondary | ICD-10-CM | POA: Diagnosis not present

## 2022-08-21 ENCOUNTER — Ambulatory Visit
Admission: RE | Admit: 2022-08-21 | Discharge: 2022-08-21 | Disposition: A | Discharge status: Home / Self Care | Payer: 59 | Source: Ambulatory Visit | Attending: Obstetrics and Gynecology | Admitting: Obstetrics and Gynecology

## 2022-08-21 DIAGNOSIS — Z1231 Encounter for screening mammogram for malignant neoplasm of breast: Secondary | ICD-10-CM | POA: Insufficient documentation

## 2022-11-30 ENCOUNTER — Telehealth: Payer: 59 | Admitting: Family

## 2022-11-30 DIAGNOSIS — L255 Unspecified contact dermatitis due to plants, except food: Secondary | ICD-10-CM

## 2022-11-30 MED ORDER — TRIAMCINOLONE ACETONIDE 0.5 % EX OINT: 1.0000 | TOPICAL_OINTMENT | Freq: Two times a day (BID) | CUTANEOUS | 0 refills | Status: DC

## 2022-11-30 MED ORDER — PREDNISONE 10 MG (21) PO TBPK: ORAL_TABLET | ORAL | 0 refills | Status: DC

## 2022-11-30 NOTE — Progress Notes (Signed)
E-Visit for [employer]  We are sorry that you are not feeing well.  Here is how we plan to help!  Based on what you have shared with me it looks like you have had an allergic reaction to the oily resin from a group of plants.  This resin is very sticky, so it easily attaches to your skin, clothing, tools equipment, and pet's fur.    This blistering rash is often called poison ivy rash although it can come from contact with the leaves, stems and roots of poison ivy, poison oak and poison sumac.  The oily resin contains urushiol (u-ROO-she-ol) that produces a skin rash on exposed skin.  The severity of the rash depends on the amount of urushiol that gets on your skin.  A section of skin with more urushiol on it may develop a rash sooner.  The rash usually develops 12-48 hours after exposure and can last two to three weeks.  Your skin must come in direct contact with the plant's oil to be affected.  Blister fluid doesn't spread the rash.  However, if you come into contact with a piece of clothing or pet fur that has urushiol on it, the rash may spread out.  You can also transfer the oil to other parts of your body with your fingers.  Often the rash looks like a straight line because of the way the plant brushes against your skin.  Since your rash is widespread or has resulted in a large number of blisters, I have prescribed an oral corticosteroid.  Please follow these recommendations:  I have sent a prednisone dose pack to your chosen pharmacy. Be sure to follow the instructions carefully and complete the entire prescription. You may use Benadryl or Caladryl topical lotions to sooth the itch and remember cool, not hot, showers and baths can help relieve the itching!  Place cool, wet compresses on the affected area for 15-30 minutes several times a day.  You may also take oral antihistamines, such as diphenhydramine (Benadryl, others), which may also help you sleep better.  Watch your skin for any purulent  (pus) drainage or red streaking from the site.  If this occurs, contact your provider.  You may require an antibiotic for a skin infection.  Make sure that the clothes you were wearing as well as any towels or sheets that may have come in contact with the oil (urushiol) are washed in detergent and hot water.       I have developed the following plan to treat your condition. I am sending you a prednisone dose pack and kenalog cream to help with the itching.   What can you do to prevent this rash?  Avoid the plants.  Learn how to identify poison ivy, poison oak and poison sumac in all seasons.  When hiking or engaging in other activities that might expose you to these plants, try to stay on cleared pathways.  If camping, make sure you pitch your tent in an area free of these plants.  Keep pets from running through wooded areas so that urushiol doesn't accidentally stick to their fur, which you may touch.  Remove or kill the plants.  In your yard, you can get rid of poison ivy by applying an herbicide or pulling it out of the ground, including the roots, while wearing heavy gloves.  Afterward remove the gloves and thoroughly wash them and your hands.  Don't burn poison ivy or related plants because the urushiol can be  carried by smoke.  Wear protective clothing.  If needed, protect your skin by wearing socks, boots, pants, long sleeves and vinyl gloves.  Wash your skin right away.  Washing off the oil with soap and water within 30 minutes of exposure may reduce your chances of getting a poison ivy rash.  Even washing after an hour or so can help reduce the severity of the rash.  If you walk through some poison ivy and then later touch your shoes, you may get some urushiol on your hands, which may then transfer to your face or body by touching or rubbing.  If the contaminated object isn't cleaned, the urushiol on it can still cause a skin reaction years later.    Be careful not to reuse towels after  you have washed your skin.  Also carefully wash clothing in detergent and hot water to remove all traces of the oil.  Handle contaminated clothing carefully so you don't transfer the urushiol to yourself, furniture, rugs or appliances.  Remember that pets can carry the oil on their fur and paws.  If you think your pet may be contaminated with urushiol, put on some long rubber gloves and give your pet a bath.  Finally, be careful not to burn these plants as the smoke can contain traces of the oil.  Inhaling the smoke may result in difficulty breathing. If that occurred you should see a physician as soon as possible.  See your doctor right away if:  The reaction is severe or widespread You inhaled the smoke from burning poison ivy and are having difficulty breathing Your skin continues to swell The rash affects your eyes, mouth or genitals Blisters are oozing pus You develop a fever greater than 100 F (37.8 C) The rash doesn't get better within a few weeks.  If you scratch the poison ivy rash, bacteria under your fingernails may cause the skin to become infected.  See your doctor if pus starts oozing from the blisters.  Treatment generally includes antibiotics.  Poison ivy treatments are usually limited to self-care methods.  And the rash typically goes away on its own in two to three weeks.     If the rash is widespread or results in a large number of blisters, your doctor may prescribe an oral corticosteroid, such as prednisone.  If a bacterial infection has developed at the rash site, your doctor may give you a prescription for an oral antibiotic.  MAKE SURE YOU  Understand these instructions. Will watch your condition. Will get help right away if you are not doing well or get worse.   Thank you for choosing an e-visit.  Your e-visit answers were reviewed by a board certified advanced clinical practitioner to complete your personal care plan. Depending upon the condition, your plan  could have included both over the counter or prescription medications.  Please review your pharmacy choice. Make sure the pharmacy is open so you can pick up prescription now. If there is a problem, you may contact your provider through [employer] and have the prescription routed to another pharmacy.  Your safety is important to [place]. If you have drug allergies check your prescription carefully.   For the next 24 hours you can use MyChart to ask questions about today's visit, request a non-urgent call back, or ask for a work or school excuse. You will get an email in the next two days asking about your experience. I hope that your e-visit has been valuable and will  speed your recovery.    Approximately 5 minutes was spent documenting and reviewing patient's chart.

## 2023-02-03 ENCOUNTER — Telehealth: Payer: Self-pay | Admitting: Family Medicine

## 2023-02-03 NOTE — Telephone Encounter (Signed)
Patient would like to know if orders for a hormone panel can be placed when she have her cpe labs in January?     She would also like to know if Dr Reece Agar would take her son as a new patient as well,he has aged out of the pediatrics office. Please advise.

## 2023-02-03 NOTE — Telephone Encounter (Signed)
Called and spoke with patient, she stated that she has been having some premenopausal symptoms she would like to discuss at OV and have hormone levels checked. She stated at a young age she had stage 3 endometriosis and put into a temporary medical menopause, and since then her body has been sensitive to hormone fluctuations. Scheduled patient OV appt in Sept.

## 2023-02-03 NOTE — Telephone Encounter (Signed)
Ok to schedule new pt appt for son.  Does she have any specific concerns regarding hormones? I can check hormone to evaluate for menopause but she is young for this. May be best to see in office to discuss then draw labs on same day after OV (come fasting to appt).

## 2023-02-17 ENCOUNTER — Encounter: Payer: Self-pay | Admitting: Family Medicine

## 2023-02-17 ENCOUNTER — Ambulatory Visit: Payer: 59 | Admitting: Family Medicine

## 2023-02-17 VITALS — BP 118/72 | HR 61 | Temp 97.3°F | Ht 67.0 in | Wt 149.5 lb

## 2023-02-17 DIAGNOSIS — G43829 Menstrual migraine, not intractable, without status migrainosus: Secondary | ICD-10-CM

## 2023-02-17 DIAGNOSIS — R232 Flushing: Secondary | ICD-10-CM | POA: Diagnosis not present

## 2023-02-17 DIAGNOSIS — N809 Endometriosis, unspecified: Secondary | ICD-10-CM | POA: Diagnosis not present

## 2023-02-17 DIAGNOSIS — F439 Reaction to severe stress, unspecified: Secondary | ICD-10-CM

## 2023-02-17 DIAGNOSIS — R5383 Other fatigue: Secondary | ICD-10-CM

## 2023-02-17 DIAGNOSIS — N951 Menopausal and female climacteric states: Secondary | ICD-10-CM | POA: Diagnosis not present

## 2023-02-17 DIAGNOSIS — G4700 Insomnia, unspecified: Secondary | ICD-10-CM

## 2023-02-17 LAB — COMPREHENSIVE METABOLIC PANEL
ALT: 13 U/L (ref 0–35)
AST: 20 U/L (ref 0–37)
Albumin: 4.1 g/dL (ref 3.5–5.2)
Alkaline Phosphatase: 40 U/L (ref 39–117)
BUN: 9 mg/dL (ref 6–23)
CO2: 28 meq/L (ref 19–32)
Calcium: 8.7 mg/dL (ref 8.4–10.5)
Chloride: 106 meq/L (ref 96–112)
Creatinine, Ser: 0.65 mg/dL (ref 0.40–1.20)
GFR: 108.67 mL/min (ref >60.00)
Glucose, Bld: 89 mg/dL (ref 70–99)
Potassium: 4.3 meq/L (ref 3.5–5.1)
Sodium: 139 meq/L (ref 135–145)
Total Bilirubin: 0.7 mg/dL (ref 0.2–1.2)
Total Protein: 6.7 g/dL (ref 6.0–8.3)

## 2023-02-17 LAB — CBC WITH DIFFERENTIAL/PLATELET
Basophils Absolute: 0 10*3/uL (ref 0.0–0.1)
Basophils Relative: 0.9 % (ref 0.0–3.0)
Eosinophils Absolute: 0.1 10*3/uL (ref 0.0–0.7)
Eosinophils Relative: 3.2 % (ref 0.0–5.0)
HCT: 40.2 % (ref 36.0–46.0)
Hemoglobin: 13.3 g/dL (ref 12.0–15.0)
Lymphocytes Relative: 49.5 % — ABNORMAL HIGH (ref 12.0–46.0)
Lymphs Abs: 2 10*3/uL (ref 0.7–4.0)
MCHC: 33.2 g/dL (ref 30.0–36.0)
MCV: 89.5 fl (ref 78.0–100.0)
Monocytes Absolute: 0.3 10*3/uL (ref 0.1–1.0)
Monocytes Relative: 6.6 % (ref 3.0–12.0)
Neutro Abs: 1.6 10*3/uL (ref 1.4–7.7)
Neutrophils Relative %: 39.8 % — ABNORMAL LOW (ref 43.0–77.0)
Platelets: 260 10*3/uL (ref 150.0–400.0)
RBC: 4.5 Mil/uL (ref 3.87–5.11)
RDW: 13.4 % (ref 11.5–15.5)
WBC: 4 10*3/uL (ref 4.0–10.5)

## 2023-02-17 LAB — TSH: TSH: 2.37 u[IU]/mL (ref 0.35–5.50)

## 2023-02-17 LAB — TESTOSTERONE: Testosterone: 19.8 ng/dL (ref 15.00–40.00)

## 2023-02-17 MED ORDER — CYCLOBENZAPRINE HCL 5 MG PO TABS: 5.0000 mg | ORAL_TABLET | Freq: Two times a day (BID) | ORAL | 1 refills | Status: DC | PRN

## 2023-02-17 MED ORDER — LORAZEPAM 0.5 MG PO TABS: 0.2500 mg | ORAL_TABLET | Freq: Two times a day (BID) | ORAL | 1 refills | Status: DC | PRN

## 2023-02-17 NOTE — Progress Notes (Unsigned)
Ph: [phone] Fax: [phone]   Patient ID: Marie Lester, female    DOB: 03-Jun-1981, 42 y.o.   MRN: [id number]  This visit was conducted in person.  BP 118/72   Pulse 61   Temp (!) 97.3 F (36.3 C) (Temporal)   Ht 5\' 7"  (1.702 m)   Wt 149 lb 8 oz (67.8 kg)   LMP 02/11/2023   SpO2 94%   BMI 23.42 kg/m    CC: menstrual headache, insomnia Subjective:   HPI: Marie Lester is a 42 y.o. female presenting on 02/17/2023 for Menopause (C/o period migraine, insomnia and feeling overwhelmed. Sxs started about 1 yr ago, worsened in past 6 mos. Thinks due to perimenopause- wants hormones checked. )   1 year of worsening menstrual headaches, fatigue, insomnia. Notes she has HA on day 19 of cycle. HA described as R periorbital headache with radiation to back of head. Can last several hours, worse at night. No photo/phonophobia, but does get nausea with this. At times activity limiting. No vision changes or aura. Manages with tylenol/benadryl with benefit. Saw GYN last year, dx menstrual migraine with trial estrace 0.5mg  for 3 days (from day 18-20 of cycle) - didn't try this due to concern over worsening vaginal bleed with estrogen. Acutely worse over the past 6 months. Wonders if hormonal issue. No fmhx migraines.   Insomnia - chronic sleep initiation insomnia, now with worsening sleep maintenance insomnia. Melatonin hasn't helped.   Notes increased anxiety - hypervigilance. Easily overwhelmed. Has not had anxiety attack but has come close. She did see counselor for this - suggested PRN anxiolytic.  Notes increased stressor over the past few years - family and work stressors.   She has started magnesium and ashwaganda, MVI, vit D, turmeric, and tart cherry juice.  Started "75 hard again" program with husband. No weight loss noted with this.   Notes perimenopausal symptoms - breast pain, fatigue, hot flashes, night sweats for years.  No unexpected weight loss. No paresthesias, numbness,  myalgia, arthralgias, dry mouth or eyes, new rashes, abd pain, bowel changes.  No known fmhx early menopause.   LMP: last week 02/11/2023 just finished spotting - becoming more regular Q21-25 days.  FSH 13.4 (02/2022)   H/o severe endometriosis age 42yo treated with laparoscopic excision of endometriosis followed by medical menopause with nasal spray (nafarelin) for 8 months.   Now on spironolactone 100mg  daily for adult acne.      Relevant past medical, surgical, family and social history reviewed and updated as indicated. Interim medical history since our last visit reviewed. Allergies and medications reviewed and updated. Outpatient Medications Prior to Visit  Medication Sig Dispense Refill   ALTRENO 0.05 % LOTN Apply topically.     EPINEPHrine 0.3 mg/0.3 mL IJ SOAJ injection Inject 0.3 mg into the muscle as needed for anaphylaxis. 2 mL 1   fluticasone (FLONASE) 50 MCG/ACT nasal spray Place 2 sprays into both nostrils daily. 16 g 6   spironolactone (ALDACTONE) 100 MG tablet Take 100 mg by mouth daily.     tretinoin (RETIN-A) 0.05 % cream tretinoin 0.05 % topical cream  APPLY A PEA SIZED AMOUNT TO FACE NIGHTLY     triamcinolone ointment (KENALOG) 0.5 % Apply 1 Application topically 2 (two) times daily. 30 g 0   predniSONE (STERAPRED UNI-PAK 21 TAB) 10 MG (21) TBPK tablet Use as directed 21 tablet 0   No facility-administered medications prior to visit.     Per HPI unless specifically indicated in ROS  section below Review of Systems  Objective:  BP 118/72   Pulse 61   Temp (!) 97.3 F (36.3 C) (Temporal)   Ht 5\' 7"  (1.702 m)   Wt 149 lb 8 oz (67.8 kg)   LMP 02/11/2023   SpO2 94%   BMI 23.42 kg/m   Wt Readings from Last 3 Encounters:  02/17/23 149 lb 8 oz (67.8 kg)  01/28/21 145 lb 8 oz (66 kg)  04/25/20 140 lb (63.5 kg)      Physical Exam Vitals and nursing note reviewed.  Constitutional:      Appearance: Normal appearance. She is not ill-appearing.  HENT:     Head:  Normocephalic and atraumatic.     Mouth/Throat:     Mouth: Mucous membranes are moist.     Pharynx: Oropharynx is clear. No oropharyngeal exudate or posterior oropharyngeal erythema.  Eyes:     General:        Right eye: No discharge.        Left eye: No discharge.     Extraocular Movements: Extraocular movements intact.     Conjunctiva/sclera: Conjunctivae normal.     Pupils: Pupils are equal, round, and reactive to light.  Neck:     Thyroid: No thyroid mass or thyromegaly.  Cardiovascular:     Rate and Rhythm: Normal rate and regular rhythm.     Pulses: Normal pulses.     Heart sounds: Normal heart sounds. No murmur heard. Pulmonary:     Effort: Pulmonary effort is normal. No respiratory distress.     Breath sounds: Normal breath sounds. No wheezing, rhonchi or rales.  Abdominal:     General: Bowel sounds are normal. There is no distension.     Palpations: Abdomen is soft. There is no mass.     Tenderness: There is no abdominal tenderness. There is no guarding or rebound.     Hernia: No hernia is present.  Musculoskeletal:     Cervical back: Normal range of motion and neck supple.     Right lower leg: No edema.     Left lower leg: No edema.  Skin:    General: Skin is warm and dry.     Capillary Refill: Capillary refill takes less than 2 seconds.  Neurological:     General: No focal deficit present.     Mental Status: She is alert.     Comments:  CN 2-12 intact FTN intact EOMI  Psychiatric:        Mood and Affect: Mood normal.        Behavior: Behavior normal.       Results for orders placed or performed in visit on 01/17/21  CBC and differential  Result Value Ref Range   Hemoglobin 13.4 12.0 - 16.0   Platelets 227 150 - 399   WBC 4.4       02/17/2023    8:59 AM 06/17/2017   11:45 AM  Depression screen PHQ 2/9  Decreased Interest 1 0  Down, Depressed, Hopeless 1 0  PHQ - 2 Score 2 0  Altered sleeping 2   Tired, decreased energy 1   Change in appetite 0    Feeling bad or failure about yourself  1   Trouble concentrating 0   Moving slowly or fidgety/restless 0   Suicidal thoughts 0   PHQ-9 Score 6   Difficult doing work/chores Somewhat difficult        02/17/2023    8:59 AM  GAD 7 : Generalized  Anxiety Score  Nervous, Anxious, on Edge 2  Control/stop worrying 2  Worry too much - different things 2  Trouble relaxing 2  Restless 2  Easily annoyed or irritable 2  Afraid - awful might happen 2  Total GAD 7 Score 14  Anxiety Difficulty Somewhat difficult   Assessment & Plan:   Problem List Items Addressed This Visit   None Visit Diagnoses     Other fatigue    -  Primary   Relevant Orders   Comprehensive metabolic panel   TSH   CBC with Differential/Platelet   Estrogens, total   Progesterone   Testosterone        Meds ordered this encounter  Medications   LORazepam (ATIVAN) 0.5 MG tablet    Sig: Take 0.5-1 tablets (0.25-0.5 mg total) by mouth 2 (two) times daily as needed for anxiety (sedation precautions).    Dispense:  20 tablet    Refill:  1   cyclobenzaprine (FLEXERIL) 5 MG tablet    Sig: Take 1-2 tablets (5-10 mg total) by mouth 2 (two) times daily as needed for muscle spasms (sedation precautions).    Dispense:  30 tablet    Refill:  1    Orders Placed This Encounter  Procedures   Comprehensive metabolic panel   TSH   CBC with Differential/Platelet   Estrogens, total   Progesterone   Testosterone    Patient Instructions  May try lorazepam 0.5mg  1/2-1 tablet as needed for anxiety/stress Continue working on healthy stress relieving strategies.  May try flexeril 5-10mg  muscle relaxant as needed for headache  Labs today   Follow up plan: No follow-ups on file.  Eustaquio Boyden, MD

## 2023-02-17 NOTE — Patient Instructions (Addendum)
May try lorazepam 0.5mg  1/2-1 tablet as needed for anxiety/stress Continue working on healthy stress relieving strategies.  May try flexeril 5-10mg  muscle relaxant as needed for headache  Labs today

## 2023-02-19 ENCOUNTER — Encounter: Payer: Self-pay | Admitting: Family Medicine

## 2023-02-19 DIAGNOSIS — G4700 Insomnia, unspecified: Secondary | ICD-10-CM | POA: Insufficient documentation

## 2023-02-19 DIAGNOSIS — G43829 Menstrual migraine, not intractable, without status migrainosus: Secondary | ICD-10-CM | POA: Insufficient documentation

## 2023-02-19 DIAGNOSIS — L709 Acne, unspecified: Secondary | ICD-10-CM | POA: Insufficient documentation

## 2023-02-19 DIAGNOSIS — F439 Reaction to severe stress, unspecified: Secondary | ICD-10-CM | POA: Insufficient documentation

## 2023-02-19 DIAGNOSIS — N951 Menopausal and female climacteric states: Secondary | ICD-10-CM | POA: Insufficient documentation

## 2023-02-19 NOTE — Assessment & Plan Note (Addendum)
Endorsing menopausal symptoms - breast pain, fatigue, hot flashes, more frequent cycles, night sweats, weight gain. No fmhx early menopause. Check labwork for further evaluation including hormone levels.

## 2023-02-19 NOTE — Assessment & Plan Note (Signed)
Worsening anxiety in setting of stressors - notes hypervigilance and borderline anxiety attacks. Also with worsening insomnia as per above.  Trial PRN lorazepam  Discussed pros and cons of controlled substances. Reviewed medication risk of dependence, tolerance, and addiction/abuse potential. Discussed recommended short term and sparing use of med.

## 2023-02-19 NOTE — Assessment & Plan Note (Signed)
H/o this, s/p laparoscopic surgical treatment age [age]yo, followed by what sounds like nasal Nafarelin (GNRH agonist) spray for 8 months (estrogen suppression).

## 2023-02-19 NOTE — Assessment & Plan Note (Addendum)
Describes characteristics of migraine around day 19 of cycle (progesterone and second estrogen peak). No personal or fmhx migraines.  She did not try oral estrogen on days 18-20 of cycle prescribed by GYN as she notes estrogen tends to worsen menstrual bleeding.  Will try flexeril PRN, with sedation precautions.  She has also recently started magnesium supplement.  Non-focal neurological exam today

## 2023-02-19 NOTE — Assessment & Plan Note (Signed)
Melatonin hasn't been helpful. Has recently started magnesium and ashwaganda.  See above.

## 2023-02-21 LAB — TEST AUTHORIZATION

## 2023-02-21 LAB — ESTROGENS, TOTAL: Estrogen: 121 pg/mL

## 2023-02-21 LAB — PROGESTERONE: Progesterone: <0.5 ng/mL

## 2023-02-21 LAB — FOLLICLE STIMULATING HORMONE: FSH: 8.6 m[IU]/mL

## 2023-02-21 LAB — PROLACTIN: Prolactin: 5.2 ng/mL

## 2023-03-16 ENCOUNTER — Encounter: Payer: Self-pay | Admitting: Family Medicine

## 2023-03-17 DIAGNOSIS — Z124 Encounter for screening for malignant neoplasm of cervix: Secondary | ICD-10-CM | POA: Diagnosis not present

## 2023-03-17 DIAGNOSIS — Z01411 Encounter for gynecological examination (general) (routine) with abnormal findings: Secondary | ICD-10-CM | POA: Diagnosis not present

## 2023-03-17 DIAGNOSIS — N921 Excessive and frequent menstruation with irregular cycle: Secondary | ICD-10-CM | POA: Diagnosis not present

## 2023-03-17 DIAGNOSIS — Z1211 Encounter for screening for malignant neoplasm of colon: Secondary | ICD-10-CM | POA: Diagnosis not present

## 2023-03-17 DIAGNOSIS — R102 Pelvic and perineal pain: Secondary | ICD-10-CM | POA: Diagnosis not present

## 2023-03-30 DIAGNOSIS — R899 Unspecified abnormal finding in specimens from other organs, systems and tissues: Secondary | ICD-10-CM | POA: Diagnosis not present

## 2023-04-01 DIAGNOSIS — N921 Excessive and frequent menstruation with irregular cycle: Secondary | ICD-10-CM | POA: Diagnosis not present

## 2023-04-01 DIAGNOSIS — N941 Unspecified dyspareunia: Secondary | ICD-10-CM | POA: Diagnosis not present

## 2023-04-01 DIAGNOSIS — Z8742 Personal history of other diseases of the female genital tract: Secondary | ICD-10-CM | POA: Diagnosis not present

## 2023-04-01 DIAGNOSIS — R102 Pelvic and perineal pain: Secondary | ICD-10-CM | POA: Diagnosis not present

## 2023-05-26 NOTE — H&P (Signed)
Marie Lester is a 42 y.o. female here for LAVH , left salpigoophorectomy and right salpingectomy  .follow up for pelvic pain , MMR , dyspareunia .   Over the past 2-3 yr she states her cycles have been worsening . Q 21-17 days and 5-7 days bleeding with the first 2 days heavy ++ clots . A h/o L/S surgery for pain and endometriosis was found - age 75. Surgeon said there was scarring to the colon  .+ dysmenorrhea , doesn't take NSAID because of stomach up set .  + dyspareunia in certain positions. + dyschezia as well Also with emotionality around  before cycle and pain left sided  and increased emotionality starting 2 weeks before cycle . Pain always on left . She was on [facility] agonists after surgery and then on OCPs but doesn't tolerate OCPs well      Op note from 2020 reviewed . Stage 3 endometriosis with scarring of left ovary to posterior uterus .     Past Medical History:  has a past medical history of Endometriosis, Fibroadenoma of right breast (age 57), and History of metrorrhagia.  Past Surgical History:  has a past surgical history that includes Augmentation mammaplasty (04/2015); hemorrhoid banding; lap excision of endometriosis (06/10/1999); and Benign fibroadenoma of right breast removed. Family History: family history includes Bipolar disorder in her maternal grandmother. Social History:  reports that she has never smoked. She has never used smokeless tobacco. She reports that she does not drink alcohol and does not use drugs. OB/GYN History:  OB History       Gravida  4   Para  4   Term  4   Preterm      AB      Living  4        SAB      IAB      Ectopic      Molar      Multiple      Live Births  4             Allergies: is allergic to amoxicillin-pot clavulanate, ciprofloxacin, codeine, and pyridium [phenazopyridine]. Medications:  Current Medications    Current Outpatient Medications:    celecoxib (CELEBREX) 200 MG capsule, Take 1 capsule (200 mg  total) by mouth once daily for 30 days, Disp: 30 capsule, Rfl: 3   tranexamic acid (LYSTEDA) 650 mg tablet, Take 2 tablets (1,300 mg total) by mouth 3 (three) times daily Take for a maximum of 5 days during monthly menstruation., Disp: 30 tablet, Rfl: 3   estradioL (ESTRACE) 0.5 MG tablet, 1 tab q day as directed for 3 days (Patient not taking: Reported on 04/01/2023), Disp: 3 tablet, Rfl: 3     Review of Systems: General:                      No fatigue or weight loss Eyes:                           No vision changes Ears:                            No hearing difficulty Respiratory:                No cough or shortness of breath Pulmonary:                  No  asthma or shortness of breath Cardiovascular:           No chest pain, palpitations, dyspnea on exertion Gastrointestinal:          No abdominal bloating, chronic diarrhea, constipations, masses, pain or hematochezia Genitourinary:             No hematuria, dysuria, abnormal vaginal discharge, +pelvic pain,+ Menometrorrhagia Lymphatic:                   No swollen lymph nodes Musculoskeletal:No muscle weakness Neurologic:                  No extremity weakness, syncope, seizure disorder Psychiatric:                  No history of depression, delusions or suicidal/homicidal ideation      Exam:       Vitals:    05/27/23 0905  BP: 113/80  Pulse: 86      Body mass index is 23.34 kg/m.   WDWN white/  female in NAD   Lungs: CTA  CV : RRR without murmur     Neck:  no thyromegaly Abdomen: soft , no mass, normal active bowel sounds,  non-tender, no rebound tenderness Pelvic: tanner stage 5 ,  External genitalia: vulva /labia no lesions Urethra: no prolapse Vagina: normal physiologic d/c, adequate room for TVH  Cervix: no lesions, no cervical motion tenderness   Uterus: normal size shape and contour, non-tender Adnexa: no mass,  non-tender   Rectovaginal:  U/s  toVisualized. Size 107 mm x 65 mm x 54 mm Enlarged Posi??on:  anteverted Malforma??ons: none Myometrium: myomatous Endometrium: appears normal/appropriate; trace fluid within. Endometrial thickness, total 12.8 mm Cervix details: nabothian cysts visualized No polyps iden??fied   Fibroid(s) uterus mul??myomatosus. 1. Size 32.00 mm x 19.00 mm x 23 mm. Mean 24.7 mm. Vol 7.3 cm. FIGO - 3 (contact with endometrium but 100% intramural)posterior to endometrium 2. Size 25.00 mm x 15.00 mm x 27 mm. Mean 22.3 mm. Vol 5.3 cm. FIGO - 2-5 (intramural with submucosal & subserosal component). Cervical. Posterior; calcified 3. Size 21.00 mm x 17.00 mm x 16 mm. Mean 18.0 mm. Vol 3.0 cm. FIGO - 4 (intramural no endometrial or serosal involvement). Right lateral wall Right Ovary Visualized, Prominent. Outline: Normal. Morphology: Appropriate. Size 42 mm x 32 mm x 19 mm No cysts iden??fied Follicles iden??fied Le?? Ovary Visualized, Normal. Outline: Normal. Morphology: Appropriate. Size 21 mm x 20 mm x 15 mm No cysts iden??fied Follicles iden??fied Cul de Sac Visualized. Anechoic. Free fluid visualized: mild Page 2 of 2 for report of pa??ent Marie Lester, DOB [birth year] Bladder Visualizedday :      Impression:    The primary encounter diagnosis was Pelvic pain in female. Diagnoses of Menorrhagia with irregular cycle, History of endometriosis, and Dyspareunia, female were also pertinent to this visit.       Plan:    Spoke to the pt about definitive surgery to alleviate bleeding and to help with her CPP presumable form endometriosis. I have reviewed her opt note2002 and she is aware that she may have advanced scarring of tissue . She is increased risks complication based on her diagnosis. I have recommended a LAVH , left SO and right salpingectomy . Information given to her regarding the procedure .      risks of the procedure discussed         Vilma Prader, MD

## 2023-06-11 ENCOUNTER — Other Ambulatory Visit: Payer: Self-pay

## 2023-06-11 ENCOUNTER — Other Ambulatory Visit: Payer: Self-pay | Admitting: Family Medicine

## 2023-06-11 ENCOUNTER — Encounter
Admission: RE | Admit: 2023-06-11 | Discharge: 2023-06-11 | Disposition: A | Discharge status: Home / Self Care | Payer: 59 | Source: Ambulatory Visit | Attending: Obstetrics and Gynecology | Admitting: Obstetrics and Gynecology

## 2023-06-11 ENCOUNTER — Other Ambulatory Visit: Payer: Self-pay | Admitting: Obstetrics and Gynecology

## 2023-06-11 VITALS — Ht 67.0 in | Wt 150.0 lb

## 2023-06-11 DIAGNOSIS — Z01812 Encounter for preprocedural laboratory examination: Secondary | ICD-10-CM

## 2023-06-11 DIAGNOSIS — Z1159 Encounter for screening for other viral diseases: Secondary | ICD-10-CM

## 2023-06-11 DIAGNOSIS — R5383 Other fatigue: Secondary | ICD-10-CM

## 2023-06-11 DIAGNOSIS — Z13220 Encounter for screening for lipoid disorders: Secondary | ICD-10-CM

## 2023-06-11 HISTORY — DX: Other specified postprocedural states: Z98.890

## 2023-06-11 HISTORY — DX: Patellofemoral disorders, right knee: M22.2X1

## 2023-06-11 HISTORY — DX: Palpitations: R00.2

## 2023-06-11 HISTORY — DX: Gastro-esophageal reflux disease without esophagitis: K21.9

## 2023-06-11 HISTORY — DX: Menopausal and female climacteric states: N95.1

## 2023-06-11 NOTE — Patient Instructions (Addendum)
 Your procedure is scheduled on:  Thursday January 9  Report to the Registration Desk on the 1st floor of the [facility] To find out your arrival time, please call [phone] between 1PM - 3PM on:  Wednesday January 8 If your arrival time is 6:00 am, do not arrive before that time as the [facility] entrance doors do not open until 6:00 am.  REMEMBER: Instructions that are not followed completely may result in serious medical risk, up to and including death; or upon the discretion of your surgeon and anesthesiologist your surgery may need to be rescheduled.  Do not eat food after midnight the night before surgery.  No gum chewing or hard candies.  You may however, drink CLEAR liquids up to 2 hours before you are scheduled to arrive for your surgery. Do not drink anything within 2 hours of your scheduled arrival time.  Clear liquids include: - water  - apple juice without pulp - gatorade (not RED colors) - black coffee or tea (Do NOT add milk or creamers to the coffee or tea) Do NOT drink anything that is not on this list.   One week prior to surgery: Stop Anti-inflammatories (NSAIDS) such as Advil, Aleve, Ibuprofen, Motrin, Naproxen, Naprosyn and Aspirin based products such as Excedrin, Goody's Powder, BC Powder. Stop ANY OVER THE COUNTER supplements until after surgery. Magnesium Salicylate  Ferrous Bisglycinate Chelate (EASY IRON)  zinc gluconate    Continue taking all of your other prescription medications up until the day of surgery.  ON THE DAY OF SURGERY ONLY TAKE THESE MEDICATIONS WITH SIPS OF WATER:  celecoxib (CELEBREX)  Multiple Vitamins-Minerals (MULTIVITAMIN WITH MINERALS  Vitamin D-Vitamin K (VITAMIN K2-VITAMIN D3 )  No Alcohol for 24 hours before or after surgery.  No Smoking including e-cigarettes for 24 hours before surgery.  No chewable tobacco products for at least 6 hours before surgery.  No nicotine patches on the day of surgery.  Do not use any  recreational drugs for at least a week (preferably 2 weeks) before your surgery.  Please be advised that the combination of cocaine and anesthesia may have negative outcomes, up to and including death. If you test positive for cocaine, your surgery will be cancelled.  On the morning of surgery brush your teeth with toothpaste and water, you may rinse your mouth with mouthwash if you wish. Do not swallow any toothpaste or mouthwash.  Use CHG Soap as directed on instruction sheet.  Do not wear jewelry, make-up, hairpins, clips or nail polish.  For welded (permanent) jewelry: bracelets, anklets, waist bands, etc.  Please have this removed prior to surgery.  If it is not removed, there is a chance that hospital personnel will need to cut it off on the day of surgery.  Do not wear lotions, powders, or perfumes.   Do not shave body hair from the neck down 48 hours before surgery.  Contact lenses, hearing aids and dentures may not be worn into surgery.  Do not bring valuables to the hospital. [facility] is not responsible for any missing/lost belongings or valuables.   Notify your doctor if there is any change in your medical condition (cold, fever, infection).  Wear comfortable clothing (specific to your surgery type) to the hospital.  After surgery, you can help prevent lung complications by doing breathing exercises.  Take deep breaths and cough every 1-2 hours. Your doctor may order a device called an Incentive Spirometer to help you take deep breaths.  When coughing  or sneezing, hold a pillow firmly against your incision with both hands. This is called "splinting." Doing this helps protect your incision. It also decreases belly discomfort.  If you are being discharged the day of surgery, you will not be allowed to drive home. You will need a responsible individual to drive you home and stay with you for 24 hours after surgery.   If you are taking public transportation, you will need  to have a responsible individual with you.  Please call the Pre-admissions Testing Dept. at [phone] if you have any questions about these instructions.  Surgery Visitation Policy:  Patients having surgery or a procedure may have two visitors.  Children under the age of 49 must have an adult with them who is not the patient.         Preparing for Surgery with CHLORHEXIDINE  GLUCONATE (CHG) Soap  Chlorhexidine  Gluconate (CHG) Soap  o An antiseptic cleaner that kills germs and bonds with the skin to continue killing germs even after washing  o Used for showering the night before surgery and morning of surgery  Before surgery, you can play an important role by reducing the number of germs on your skin.  CHG (Chlorhexidine  gluconate) soap is an antiseptic cleanser which kills germs and bonds with the skin to continue killing germs even after washing.  Please do not use if you have an allergy to CHG or antibacterial soaps. If your skin becomes reddened/irritated stop using the CHG.  1. Shower the NIGHT BEFORE SURGERY and the MORNING OF SURGERY with CHG soap.  2. If you choose to wash your hair, wash your hair first as usual with your normal shampoo.  3. After shampooing, rinse your hair and body thoroughly to remove the shampoo.  4. Use CHG as you would any other liquid soap. You can apply CHG directly to the skin and wash gently with a scrungie or a clean washcloth.  5. Apply the CHG soap to your body only from the neck down. Do not use on open wounds or open sores. Avoid contact with your eyes, ears, mouth, and genitals (private parts). Wash face and genitals (private parts) with your normal soap.  6. Wash thoroughly, paying special attention to the area where your surgery will be performed.  7. Thoroughly rinse your body with warm water.  8. Do not shower/wash with your normal soap after using and rinsing off the CHG soap.  9. Pat yourself dry with a clean  towel.  10. Wear clean pajamas to bed the night before surgery.  12. Place clean sheets on your bed the night of your first shower and do not sleep with pets.  13. Shower again with the CHG soap on the day of surgery prior to arriving at the hospital.  14. Do not apply any deodorants/lotions/powders.  15. Please wear clean clothes to the hospital.      How to Use an Incentive Spirometer  An incentive spirometer is a tool that measures how well you are filling your lungs with each breath. Learning to take long, deep breaths using this tool can help you keep your lungs clear and active. This may help to reverse or lessen your chance of developing breathing (pulmonary) problems, especially infection. You may be asked to use a spirometer: After a surgery. If you have a lung problem or a history of smoking. After a long period of time when you have been unable to move or be active. If the spirometer includes an  indicator to show the highest number that you have reached, your health care provider or respiratory therapist will help you set a goal. Keep a log of your progress as told by your health care provider. What are the risks? Breathing too quickly may cause dizziness or cause you to pass out. Take your time so you do not get dizzy or light-headed. If you are in pain, you may need to take pain medicine before doing incentive spirometry. It is harder to take a deep breath if you are having pain. How to use your incentive spirometer  Sit up on the edge of your bed or on a chair. Hold the incentive spirometer so that it is in an upright position. Before you use the spirometer, breathe out normally. Place the mouthpiece in your mouth. Make sure your lips are closed tightly around it. Breathe in slowly and as deeply as you can through your mouth, causing the piston or the ball to rise toward the top of the chamber. Hold your breath for 3-5 seconds, or for as long as possible. If the  spirometer includes a coach indicator, use this to guide you in breathing. Slow down your breathing if the indicator goes above the marked areas. Remove the mouthpiece from your mouth and breathe out normally. The piston or ball will return to the bottom of the chamber. Rest for a few seconds, then repeat the steps 10 or more times. Take your time and take a few normal breaths between deep breaths so that you do not get dizzy or light-headed. Do this every 1-2 hours when you are awake. If the spirometer includes a goal marker to show the highest number you have reached (best effort), use this as a goal to work toward during each repetition. After each set of 10 deep breaths, cough a few times. This will help to make sure that your lungs are clear. If you have an incision on your chest or abdomen from surgery, place a pillow or a rolled-up towel firmly against the incision when you cough. This can help to reduce pain while taking deep breaths and coughing. General tips When you are able to get out of bed: Walk around often. Continue to take deep breaths and cough in order to clear your lungs. Keep using the incentive spirometer until your health care provider says it is okay to stop using it. If you have been in the hospital, you may be told to keep using the spirometer at home. Contact a health care provider if: You are having difficulty using the spirometer. You have trouble using the spirometer as often as instructed. Your pain medicine is not giving enough relief for you to use the spirometer as told. You have a fever. Get help right away if: You develop shortness of breath. You develop a cough with bloody mucus from the lungs. You have fluid or blood coming from an incision site after you cough. Summary An incentive spirometer is a tool that can help you learn to take long, deep breaths to keep your lungs clear and active. You may be asked to use a spirometer after a surgery, if you have  a lung problem or a history of smoking, or if you have been inactive for a long period of time. Use your incentive spirometer as instructed every 1-2 hours while you are awake. If you have an incision on your chest or abdomen, place a pillow or a rolled-up towel firmly against your incision when you cough. This  will help to reduce pain. Get help right away if you have shortness of breath, you cough up bloody mucus, or blood comes from your incision when you cough. This information is not intended to replace advice given to you by your health care provider. Make sure you discuss any questions you have with your health care provider. Document Revised: 08/15/2019 Document Reviewed: 08/15/2019 Elsevier Patient Education  2023 [employer].

## 2023-06-15 ENCOUNTER — Other Ambulatory Visit: Payer: 59

## 2023-06-15 ENCOUNTER — Encounter
Admission: RE | Admit: 2023-06-15 | Discharge: 2023-06-15 | Disposition: A | Discharge status: Home / Self Care | Payer: 59 | Source: Ambulatory Visit | Attending: Obstetrics and Gynecology | Admitting: Obstetrics and Gynecology

## 2023-06-15 DIAGNOSIS — Z01812 Encounter for preprocedural laboratory examination: Secondary | ICD-10-CM | POA: Insufficient documentation

## 2023-06-15 DIAGNOSIS — Z01818 Encounter for other preprocedural examination: Secondary | ICD-10-CM

## 2023-06-15 LAB — CBC
HCT: 40 % (ref 36.0–46.0)
Hemoglobin: 13.8 g/dL (ref 12.0–15.0)
MCH: 29.6 pg (ref 26.0–34.0)
MCHC: 34.5 g/dL (ref 30.0–36.0)
MCV: 85.8 fL (ref 80.0–100.0)
Platelets: 277 10*3/uL (ref 150–400)
RBC: 4.66 MIL/uL (ref 3.87–5.11)
RDW: 12.2 % (ref 11.5–15.5)
WBC: 4.2 10*3/uL (ref 4.0–10.5)
nRBC: 0 % (ref 0.0–0.2)

## 2023-06-15 LAB — BASIC METABOLIC PANEL
Anion gap: 7 (ref 5–15)
BUN: 12 mg/dL (ref 6–20)
CO2: 25 mmol/L (ref 22–32)
Calcium: 8.9 mg/dL (ref 8.9–10.3)
Chloride: 105 mmol/L (ref 98–111)
Creatinine, Ser: 0.68 mg/dL (ref 0.44–1.00)
GFR, Estimated: >60 mL/min (ref >60)
Glucose, Bld: 99 mg/dL (ref 70–99)
Potassium: 4.1 mmol/L (ref 3.5–5.1)
Sodium: 137 mmol/L (ref 135–145)

## 2023-06-15 LAB — TYPE AND SCREEN
ABO/RH(D): O POS
Antibody Screen: NEGATIVE

## 2023-06-18 ENCOUNTER — Ambulatory Visit
Admission: RE | Admit: 2023-06-18 | Discharge: 2023-06-18 | Disposition: A | Discharge status: Home / Self Care | Payer: 59 | Attending: Obstetrics and Gynecology | Admitting: Obstetrics and Gynecology

## 2023-06-18 ENCOUNTER — Encounter
Admission: RE | Disposition: A | Discharge status: Home / Self Care | Payer: Self-pay | Source: Home / Self Care | Attending: Obstetrics and Gynecology

## 2023-06-18 ENCOUNTER — Ambulatory Visit: Payer: 59 | Admitting: Certified Registered"

## 2023-06-18 ENCOUNTER — Other Ambulatory Visit: Payer: Self-pay

## 2023-06-18 ENCOUNTER — Encounter: Payer: Self-pay | Admitting: Obstetrics and Gynecology

## 2023-06-18 DIAGNOSIS — N736 Female pelvic peritoneal adhesions (postinfective): Secondary | ICD-10-CM | POA: Diagnosis not present

## 2023-06-18 DIAGNOSIS — N838 Other noninflammatory disorders of ovary, fallopian tube and broad ligament: Secondary | ICD-10-CM | POA: Diagnosis not present

## 2023-06-18 DIAGNOSIS — N888 Other specified noninflammatory disorders of cervix uteri: Secondary | ICD-10-CM | POA: Diagnosis not present

## 2023-06-18 DIAGNOSIS — D251 Intramural leiomyoma of uterus: Secondary | ICD-10-CM | POA: Diagnosis not present

## 2023-06-18 DIAGNOSIS — N9489 Other specified conditions associated with female genital organs and menstrual cycle: Secondary | ICD-10-CM | POA: Diagnosis not present

## 2023-06-18 DIAGNOSIS — Z01812 Encounter for preprocedural laboratory examination: Secondary | ICD-10-CM

## 2023-06-18 DIAGNOSIS — Z01818 Encounter for other preprocedural examination: Secondary | ICD-10-CM

## 2023-06-18 DIAGNOSIS — N946 Dysmenorrhea, unspecified: Secondary | ICD-10-CM | POA: Insufficient documentation

## 2023-06-18 DIAGNOSIS — N920 Excessive and frequent menstruation with regular cycle: Secondary | ICD-10-CM | POA: Insufficient documentation

## 2023-06-18 DIAGNOSIS — G8929 Other chronic pain: Secondary | ICD-10-CM | POA: Diagnosis not present

## 2023-06-18 DIAGNOSIS — D259 Leiomyoma of uterus, unspecified: Secondary | ICD-10-CM | POA: Diagnosis not present

## 2023-06-18 DIAGNOSIS — R102 Pelvic and perineal pain: Secondary | ICD-10-CM | POA: Insufficient documentation

## 2023-06-18 DIAGNOSIS — N809 Endometriosis, unspecified: Secondary | ICD-10-CM | POA: Diagnosis not present

## 2023-06-18 DIAGNOSIS — N941 Unspecified dyspareunia: Secondary | ICD-10-CM | POA: Diagnosis not present

## 2023-06-18 DIAGNOSIS — N879 Dysplasia of cervix uteri, unspecified: Secondary | ICD-10-CM | POA: Insufficient documentation

## 2023-06-18 HISTORY — PX: LAPAROSCOPIC VAGINAL HYSTERECTOMY WITH SALPINGO OOPHORECTOMY: SHX6681

## 2023-06-18 LAB — POCT PREGNANCY, URINE: Preg Test, Ur: NEGATIVE

## 2023-06-18 LAB — ABO/RH: ABO/RH(D): O POS

## 2023-06-18 SURGERY — HYSTERECTOMY, VAGINAL, LAPAROSCOPY-ASSISTED, WITH SALPINGO-OOPHORECTOMY
Anesthesia: General | Site: Abdomen | Laterality: Left

## 2023-06-18 MED ORDER — SCOPOLAMINE 1 MG/3DAYS TD PT72
1.0000 | MEDICATED_PATCH | TRANSDERMAL | Status: DC
  Administered 2023-06-18: 1.5 mg via TRANSDERMAL

## 2023-06-18 MED ORDER — PROPOFOL 10 MG/ML IV BOLUS
INTRAVENOUS | Status: AC
  Filled 2023-06-18: qty 40

## 2023-06-18 MED ORDER — GABAPENTIN 300 MG PO CAPS
ORAL_CAPSULE | ORAL | Status: AC
  Filled 2023-06-18: qty 1

## 2023-06-18 MED ORDER — CHLORHEXIDINE GLUCONATE 0.12 % MT SOLN
OROMUCOSAL | Status: AC
  Filled 2023-06-18: qty 15

## 2023-06-18 MED ORDER — SUGAMMADEX SODIUM 200 MG/2ML IV SOLN
INTRAVENOUS | Status: DC | PRN
  Administered 2023-06-18: 136 mg via INTRAVENOUS

## 2023-06-18 MED ORDER — ROCURONIUM BROMIDE 10 MG/ML (PF) SYRINGE
PREFILLED_SYRINGE | INTRAVENOUS | Status: AC
  Filled 2023-06-18: qty 10

## 2023-06-18 MED ORDER — DEXAMETHASONE SODIUM PHOSPHATE 10 MG/ML IJ SOLN
INTRAMUSCULAR | Status: AC
  Filled 2023-06-18: qty 1

## 2023-06-18 MED ORDER — OXYCODONE HCL 5 MG PO TABS
ORAL_TABLET | ORAL | Status: AC
  Filled 2023-06-18: qty 1

## 2023-06-18 MED ORDER — ONDANSETRON 4 MG PO TBDP: 4.0000 mg | ORAL_TABLET | Freq: Four times a day (QID) | ORAL | Status: DC | PRN

## 2023-06-18 MED ORDER — POVIDONE-IODINE 10 % EX SWAB
2.0000 | Freq: Once | CUTANEOUS | Status: AC
Start: 2023-06-18 — End: 2023-06-18
  Administered 2023-06-18: 2 via TOPICAL

## 2023-06-18 MED ORDER — LIDOCAINE HCL (PF) 2 % IJ SOLN
INTRAMUSCULAR | Status: AC
  Filled 2023-06-18: qty 5

## 2023-06-18 MED ORDER — CEFAZOLIN SODIUM-DEXTROSE 2-4 GM/100ML-% IV SOLN
INTRAVENOUS | Status: AC
  Filled 2023-06-18: qty 100

## 2023-06-18 MED ORDER — OXYCODONE HCL 5 MG/5ML PO SOLN: 5.0000 mg | Freq: Once | ORAL | Status: DC | PRN

## 2023-06-18 MED ORDER — SODIUM CHLORIDE 0.9% FLUSH: 10.0000 mL | Freq: Two times a day (BID) | INTRAVENOUS | Status: DC

## 2023-06-18 MED ORDER — MIDAZOLAM HCL 2 MG/2ML IJ SOLN
INTRAMUSCULAR | Status: DC | PRN
  Administered 2023-06-18: 2 mg via INTRAVENOUS

## 2023-06-18 MED ORDER — ONDANSETRON HCL 4 MG/2ML IJ SOLN
INTRAMUSCULAR | Status: AC
  Filled 2023-06-18: qty 2

## 2023-06-18 MED ORDER — HYDROMORPHONE HCL 1 MG/ML IJ SOLN
INTRAMUSCULAR | Status: DC | PRN
  Administered 2023-06-18: .5 mg via INTRAVENOUS

## 2023-06-18 MED ORDER — LIDOCAINE-EPINEPHRINE 1 %-1:100000 IJ SOLN
INTRAMUSCULAR | Status: AC
  Filled 2023-06-18: qty 1

## 2023-06-18 MED ORDER — MIDAZOLAM HCL 2 MG/2ML IJ SOLN
INTRAMUSCULAR | Status: AC
Start: 2023-06-18 — End: ?
  Filled 2023-06-18: qty 2

## 2023-06-18 MED ORDER — BUPIVACAINE HCL (PF) 0.5 % IJ SOLN
INTRAMUSCULAR | Status: AC
  Filled 2023-06-18: qty 30

## 2023-06-18 MED ORDER — DEXMEDETOMIDINE HCL IN NACL 80 MCG/20ML IV SOLN
INTRAVENOUS | Status: DC | PRN
  Administered 2023-06-18 (×3): 4 ug via INTRAVENOUS

## 2023-06-18 MED ORDER — FENTANYL CITRATE (PF) 100 MCG/2ML IJ SOLN
INTRAMUSCULAR | Status: AC
  Filled 2023-06-18: qty 2

## 2023-06-18 MED ORDER — PROPOFOL 1000 MG/100ML IV EMUL
INTRAVENOUS | Status: AC
  Filled 2023-06-18: qty 100

## 2023-06-18 MED ORDER — ORAL CARE MOUTH RINSE: 15.0000 mL | Freq: Once | OROMUCOSAL | Status: AC

## 2023-06-18 MED ORDER — PROPOFOL 10 MG/ML IV BOLUS
INTRAVENOUS | Status: DC | PRN
  Administered 2023-06-18: 120 mg via INTRAVENOUS

## 2023-06-18 MED ORDER — ONDANSETRON HCL 4 MG/2ML IJ SOLN
INTRAMUSCULAR | Status: DC | PRN
  Administered 2023-06-18: 4 mg via INTRAVENOUS

## 2023-06-18 MED ORDER — CEFAZOLIN SODIUM-DEXTROSE 2-4 GM/100ML-% IV SOLN
2.0000 g | Freq: Once | INTRAVENOUS | Status: AC
  Administered 2023-06-18: 2 g via INTRAVENOUS

## 2023-06-18 MED ORDER — PROPOFOL 500 MG/50ML IV EMUL
INTRAVENOUS | Status: DC | PRN
  Administered 2023-06-18: 100 ug/kg/min via INTRAVENOUS

## 2023-06-18 MED ORDER — SCOPOLAMINE 1 MG/3DAYS TD PT72
MEDICATED_PATCH | TRANSDERMAL | Status: AC
  Filled 2023-06-18: qty 1

## 2023-06-18 MED ORDER — FENTANYL CITRATE (PF) 100 MCG/2ML IJ SOLN
25.0000 ug | INTRAMUSCULAR | Status: DC | PRN
  Administered 2023-06-18 (×2): 25 ug via INTRAVENOUS

## 2023-06-18 MED ORDER — ROCURONIUM BROMIDE 100 MG/10ML IV SOLN
INTRAVENOUS | Status: DC | PRN
  Administered 2023-06-18 (×2): 20 mg via INTRAVENOUS
  Administered 2023-06-18: 50 mg via INTRAVENOUS
  Administered 2023-06-18 (×3): 10 mg via INTRAVENOUS
  Administered 2023-06-18 (×2): 20 mg via INTRAVENOUS

## 2023-06-18 MED ORDER — OXYCODONE HCL 5 MG PO TABS: 5.0000 mg | ORAL_TABLET | Freq: Once | ORAL | Status: DC | PRN

## 2023-06-18 MED ORDER — DEXAMETHASONE SODIUM PHOSPHATE 10 MG/ML IJ SOLN
INTRAMUSCULAR | Status: DC | PRN
  Administered 2023-06-18: 10 mg via INTRAVENOUS

## 2023-06-18 MED ORDER — CHLORHEXIDINE GLUCONATE 0.12 % MT SOLN
15.0000 mL | Freq: Once | OROMUCOSAL | Status: AC
  Administered 2023-06-18: 15 mL via OROMUCOSAL

## 2023-06-18 MED ORDER — LACTATED RINGERS IV SOLN: INTRAVENOUS | Status: DC | PRN

## 2023-06-18 MED ORDER — KETOROLAC TROMETHAMINE 30 MG/ML IJ SOLN
INTRAMUSCULAR | Status: DC | PRN
  Administered 2023-06-18: 30 mg via INTRAVENOUS

## 2023-06-18 MED ORDER — GLYCOPYRROLATE 0.2 MG/ML IJ SOLN
INTRAMUSCULAR | Status: AC
  Filled 2023-06-18: qty 1

## 2023-06-18 MED ORDER — ACETAMINOPHEN 500 MG PO TABS
1000.0000 mg | ORAL_TABLET | ORAL | Status: AC
  Administered 2023-06-18: 1000 mg via ORAL

## 2023-06-18 MED ORDER — HYDROMORPHONE HCL 1 MG/ML IJ SOLN
INTRAMUSCULAR | Status: AC
  Filled 2023-06-18: qty 1

## 2023-06-18 MED ORDER — GABAPENTIN 300 MG PO CAPS
300.0000 mg | ORAL_CAPSULE | ORAL | Status: AC
  Administered 2023-06-18: 300 mg via ORAL

## 2023-06-18 MED ORDER — ACETAMINOPHEN 500 MG PO TABS
ORAL_TABLET | ORAL | Status: AC
  Filled 2023-06-18: qty 2

## 2023-06-18 MED ORDER — OXYCODONE HCL 5 MG PO TABS
5.0000 mg | ORAL_TABLET | ORAL | Status: DC | PRN
  Administered 2023-06-18: 5 mg via ORAL

## 2023-06-18 MED ORDER — DIPHENHYDRAMINE HCL 50 MG/ML IJ SOLN
INTRAMUSCULAR | Status: DC | PRN
  Administered 2023-06-18: 12.5 mg via INTRAVENOUS

## 2023-06-18 MED ORDER — DEXMEDETOMIDINE HCL IN NACL 80 MCG/20ML IV SOLN
INTRAVENOUS | Status: AC
  Filled 2023-06-18: qty 20

## 2023-06-18 MED ORDER — FENTANYL CITRATE (PF) 100 MCG/2ML IJ SOLN
INTRAMUSCULAR | Status: DC | PRN
  Administered 2023-06-18 (×2): 50 ug via INTRAVENOUS

## 2023-06-18 MED ORDER — SODIUM CHLORIDE 0.9 % IR SOLN
Status: DC | PRN
  Administered 2023-06-18: 300 mL

## 2023-06-18 MED ORDER — KETOROLAC TROMETHAMINE 30 MG/ML IJ SOLN
INTRAMUSCULAR | Status: AC
  Filled 2023-06-18: qty 1

## 2023-06-18 MED ORDER — DIPHENHYDRAMINE HCL 50 MG/ML IJ SOLN
INTRAMUSCULAR | Status: AC
  Filled 2023-06-18: qty 1

## 2023-06-18 MED ORDER — BUPIVACAINE HCL 0.5 % IJ SOLN
INTRAMUSCULAR | Status: DC | PRN
  Administered 2023-06-18: 9 mL

## 2023-06-18 MED ORDER — LIDOCAINE-EPINEPHRINE 1 %-1:100000 IJ SOLN
INTRAMUSCULAR | Status: DC | PRN
  Administered 2023-06-18: 10 mL

## 2023-06-18 SURGICAL SUPPLY — 38 items
BAG URINE DRAIN 2000ML AR STRL (UROLOGICAL SUPPLIES) ×1 IMPLANT
BLADE SURG SZ11 CARB STEEL (BLADE) ×1 IMPLANT
CATH URTH 16FR FL 2W BLN LF (CATHETERS) ×1 IMPLANT
CHLORAPREP W/TINT 26 (MISCELLANEOUS) ×1 IMPLANT
DRAPE SURG 17X11 SM STRL (DRAPES) ×1 IMPLANT
DRSG TEGADERM 2-3/8X2-3/4 SM (GAUZE/BANDAGES/DRESSINGS) ×4 IMPLANT
ELECT REM PT RETURN 9FT ADLT (ELECTROSURGICAL) ×1
ELECTRODE REM PT RTRN 9FT ADLT (ELECTROSURGICAL) ×1 IMPLANT
GAUZE 4X4 16PLY ~~LOC~~+RFID DBL (SPONGE) ×3 IMPLANT
GAUZE SPONGE 2X2 STRL 8-PLY (GAUZE/BANDAGES/DRESSINGS) ×3 IMPLANT
GLOVE SURG SYN 8.0 (GLOVE) ×3
GLOVE SURG SYN 8.0 PF PI (GLOVE) ×3 IMPLANT
GOWN STRL REUS W/ TWL LRG LVL3 (GOWN DISPOSABLE) ×2 IMPLANT
GOWN STRL REUS W/ TWL XL LVL3 (GOWN DISPOSABLE) ×3 IMPLANT
IRRIGATION STRYKERFLOW (MISCELLANEOUS) ×1 IMPLANT
IRRIGATOR STRYKERFLOW (MISCELLANEOUS) ×1
IV NS 1000ML BAXH (IV SOLUTION) IMPLANT
KIT PINK PAD W/HEAD ARE REST (MISCELLANEOUS) ×1
KIT PINK PAD W/HEAD ARM REST (MISCELLANEOUS) ×1 IMPLANT
LABEL OR SOLS (LABEL) ×1 IMPLANT
MANIFOLD NEPTUNE II (INSTRUMENTS) ×1 IMPLANT
NDL HYPO 22X1.5 SAFETY MO (MISCELLANEOUS) ×1 IMPLANT
NEEDLE HYPO 22X1.5 SAFETY MO (MISCELLANEOUS) ×1
PACK BASIN MINOR ARMC (MISCELLANEOUS) ×1 IMPLANT
PACK GYN LAPAROSCOPIC (MISCELLANEOUS) ×1 IMPLANT
PAD PREP 24X41 OB/GYN DISP (PERSONAL CARE ITEMS) IMPLANT
SHEARS HARMONIC 36 ACE (MISCELLANEOUS) IMPLANT
SLEEVE Z-THREAD 5X100MM (TROCAR) ×2 IMPLANT
SOL PREP PVP 2OZ (MISCELLANEOUS) ×2
SOLUTION PREP PVP 2OZ (MISCELLANEOUS) ×2 IMPLANT
SUT VIC AB 0 CT1 27XCR 8 STRN (SUTURE) ×2 IMPLANT
SUT VIC AB 0 CT1 36 (SUTURE) ×1 IMPLANT
SUT VIC AB 4-0 SH 27XANBCTRL (SUTURE) ×1 IMPLANT
SYR 10ML LL (SYRINGE) ×1 IMPLANT
SYR CONTROL 10ML LL (SYRINGE) ×1 IMPLANT
TRAP FLUID SMOKE EVACUATOR (MISCELLANEOUS) ×1 IMPLANT
TROCAR Z-THREAD FIOS 5X100MM (TROCAR) ×1 IMPLANT
TUBING EVAC SMOKE HEATED PNEUM (TUBING) ×1 IMPLANT

## 2023-06-18 NOTE — Anesthesia Postprocedure Evaluation (Signed)
 Anesthesia Post Note  Patient: Marie Lester  Procedure(s) Performed: LAPAROSCOPIC ASSISTED VAGINAL HYSTERECTOMY WITH  LEFT SALPINGO OOPHORECTOMY, RIGHT SALPINGECTOMY (Left: Abdomen)  Patient location during evaluation: PACU Anesthesia Type: General Level of consciousness: awake and alert Pain management: pain level controlled Vital Signs Assessment: post-procedure vital signs reviewed and stable Respiratory status: spontaneous breathing, nonlabored ventilation, respiratory function stable and patient connected to nasal cannula oxygen Cardiovascular status: blood pressure returned to baseline and stable Postop Assessment: no apparent nausea or vomiting Anesthetic complications: no  No notable events documented.   Last Vitals:  Vitals:   06/18/23 1046 06/18/23 1057  BP: 90/61   Pulse: 66 69  Resp: 15 12  Temp: 36.6 C   SpO2: 100% 100%    Last Pain:  Vitals:   06/18/23 1057  TempSrc:   PainSc: 0-No pain                 Debby Mines

## 2023-06-18 NOTE — Anesthesia Procedure Notes (Signed)
 Procedure Name: Intubation Date/Time: 06/18/2023 7:38 AM  Performed by: Duwayne Craven, CRNAPre-anesthesia Checklist: Patient identified, Patient being monitored, Timeout performed, Emergency Drugs available and Suction available Patient Re-evaluated:Patient Re-evaluated prior to induction Oxygen Delivery Method: Circle system utilized Preoxygenation: Pre-oxygenation with 100% oxygen Induction Type: IV induction Ventilation: Mask ventilation without difficulty Laryngoscope Size: 3 and McGrath Grade View: Grade I Tube type: Oral Tube size: 6.5 mm Number of attempts: 1 Airway Equipment and Method: Stylet Placement Confirmation: ETT inserted through vocal cords under direct vision, positive ETCO2 and breath sounds checked- equal and bilateral Secured at: 21 cm Tube secured with: Tape Dental Injury: Teeth and Oropharynx as per pre-operative assessment

## 2023-06-18 NOTE — Discharge Instructions (Signed)
 You received 5mg  oxycodone today at 11:35am  you may repeat if needed at 3:35pm today

## 2023-06-18 NOTE — Op Note (Signed)
 Marie Lester, C[place]INS MEDICAL RECORD NO: [id number] ACCOUNT NO: [id number] DATE OF BIRTH: July 30, 1980 FACILITY: [facility] LOCATION: [facility] PHYSICIAN: Debby DOROTHA Dinsmore, MD  Operative Report   DATE OF PROCEDURE: 06/18/2023  PREOPERATIVE DIAGNOSES: 1.  Chronic pelvic pain. 2.  Dysmenorrhea. 3.  Menorrhagia.  POSTOPERATIVE DIAGNOSES: 1.  Chronic pelvic pain. 2.  Dysmenorrhea. 3.  Menorrhagia. 4.  Pelvic adhesive disease.  PROCEDURE: 1.  Laparoscopic-assisted vaginal hysterectomy. 2.  Left salpingo-oophorectomy. 3.  Right salpingectomy.  SURGEON:  Debby DOROTHA Dinsmore, MD  FIRST ASSISTANT:  Heather Penton, MD  SECOND ASSISTANT:  PA student, Luke.  ANESTHESIA:  General endotracheal anesthesia.  INDICATIONS:  A 43 year old female with a long history of chronic pelvic pain, pain isolated mainly on the left lower pelvis.  The patient has a remote history of endometriosis with surgery at age 40 demonstrating left ovary adhesed to the uterus.  The  patient has abnormal heavy bleeding and dyspareunia as well.  DESCRIPTION OF PROCEDURE:  After adequate general endotracheal anesthesia, the patient was placed in the dorsal supine position with the legs in the [place] stirrups.  Abdomen, perineal, and vaginal prep was performed without difficulty.  Timeout was  performed.  Foley catheter was placed in the bladder yielding 75 mL of clear urine.  Foley will be kept in for the procedure until the vaginal portion of the procedure was performed.  The speculum was placed in the vagina.  The cervix was grasped with a  single-tooth tenaculum and a Cohen cannula was placed in the endocervical to be used for uterine manipulation.  The patient did receive 2 g of IV Ancef  prior to commencement of the procedure for surgical prophylaxis.  Gloves and gown were changed.   Attention was directed to the patient's abdomen where a 5 mm infraumbilical incision was made after injecting with 0.5% Marcaine .  The  5 mm laparoscope was advanced under direct visualization with the Optiview cannula.  The patient's abdomen was  insufflated with carbon dioxide.  Second port was placed in the left lower quadrant 3 cm medial to the left anterior iliac spine.  A 5-mm trocar was advanced under direct visualization.  Third port placement was performed at the right lower quadrant  again 3 cm medial to the right anterior iliac spine.  A 5-mm trocar was advanced under direct visualization.  Initial impression revealed significant colonic epiploic adhesions to the left sidewall obscuring the left infundibulopelvic ligament.  The left  ovary was densely adherent to the lateral side of the uterus and to the left sidewall.  No specific evidence of endometriosis was noted.  Upper abdomen appeared normal.  Appendix appeared normal.  Harmonic scalpel was brought up to the operative field  and the left sidewall adhesions were taken down with the Harmonic scalpel meticulously.  The left ureter was identified clear from the adhesions and ultimately the left ovary was freed from the left sidewall with a harmonic scalpel.  The right ureter  showed normal peristaltic activity.  The left infundibulopelvic ligament was cauterized, transected, and freed.  The left round ligament was dissected and opened.  The anterior broad ligament and peritoneum was opened to the level of the cervix.  The  left uterine artery was then cauterized and transected.  Attention was then directed to the patient's right fallopian tube, which was picked up at the fimbriated end and the mesosalpinx was dissected with the Kleppinger, then dissected with the Harmonic  scalpel.  The left ovary appeared normal with a small ovarian  cyst that appeared benign.  The broad ligament was opened and the right uterine artery was then skeletonized, cauterized, and transected.  The bladder flap was continued to the previously  dissected portion.  Good hemostasis was noted.  Attention  was then directed vaginally where the legs were placed in deep flexion and a weighted speculum was placed in the posterior vaginal vault and the cervix was grasped with two thyroid  tenacula.  The  cervix was then circumferentially injected with 1% lidocaine  with 1:100,000 epinephrine .  A direct posterior colpotomy incision was made.  Upon entry into the posterior cul-de-sac, the peritoneal edge was tagged to the vaginal cuff with two separate 0  Vicryl sutures.  Long billed weighted speculum was faced to the posterior cul-de-sac and the uterosacral ligaments were then bilaterally clamped, transected, and tagged for later identification.  Anterior cervix was then circumferentially incised with  the Bovie.  The cardinal ligaments were then bilaterally clamped, transected, and suture ligated with 0 Vicryl suture.  The anterior cul-de-sac was entered sharply and a Deaver retractor placed within and two additional clamping and cutting was needed on  both sides to free the uterus, the left fallopian tube and ovary and the right fallopian tube free so that all structures could be brought down through the vaginal cuff.  Good hemostasis was noted at this point.  The vaginal cuff was then closed with a  running 0 Vicryl suture in a running non-locking fashion.  The uterosacral ligaments were plicated centrally and the rest of the cuff closed with a running 0 Vicryl suture.  Straight catheterization of the bladder was performed after the Foley catheter  had been previously removed during the vaginal portion of the procedure and followed on straight catheterization of the bladder at the end of the procedure, yielded 25 mL of clear urine.  Gloves and gown were changed by all and attention was directed to  the patient's abdomen again and the patient's abdomen was insufflated and good hemostasis was noted.  The patient's abdomen was irrigated and suctioned and pressure was lowered to 7 mmHg with good hemostasis noted.  The  patient's abdomen was then  deflated and all trocars were removed and all incision sites were closed with interrupted 4-0 Vicryl suture.  Sterile dressing was applied.  There were no complications.  ESTIMATED BLOOD LOSS:  40 mL  INTRAUTERINE FLUIDS:  900 mL.  URINE OUTPUT:  100 mL.  DISPOSITION:  The patient was taken to the recovery room in good condition.  She did receive 30 mg intravenous Toradol  at the end of the procedure and was taken to the recovery room in good condition.   PUS D: 06/18/2023 11:45:19 am T: 06/18/2023 12:06:00 pm  JOB: [id number]/ [id number]

## 2023-06-18 NOTE — Progress Notes (Signed)
 Patient voided in post op

## 2023-06-18 NOTE — Progress Notes (Signed)
 Patient tolerated gingerale and crackers in pacu without event

## 2023-06-18 NOTE — Brief Op Note (Signed)
 06/18/2023  10:16 AM  PATIENT:  Marie Lester  43 y.o. female  PRE-OPERATIVE DIAGNOSIS:  pelvic pain, menorrhagia, dyspareunia, endometriosis  POST-OPERATIVE DIAGNOSIS:  pelvic pain, menorrhagia, dyspareunia, endometriosis  PROCEDURE:  Procedure(s): LAPAROSCOPIC ASSISTED VAGINAL HYSTERECTOMY WITH  LEFT SALPINGO OOPHORECTOMY, RIGHT SALPINGECTOMY (Left)  SURGEON:  Surgeons and Role:    * Oanh Devivo, Debby PARAS, MD - Primary    * Verdon Keen, MD - Assisting  PHYSICIAN ASSISTANT: PA student KIM   ASSISTANTS: none   ANESTHESIA:   general  EBL:  40 mL IOF 900 cc uo 100 cc  BLOOD ADMINISTERED:none  DRAINS: none   LOCAL MEDICATIONS USED:  LIDOCAINE    SPECIMEN:  Source of Specimen:  cx , uterus , left tube and ovary , right fallopian tube   DISPOSITION OF SPECIMEN:  PATHOLOGY  COUNTS:  YES  TOURNIQUET:  * No tourniquets in log *  DICTATION: .Other Dictation: Dictation Number verbal  PLAN OF CARE: Discharge to home after PACU  PATIENT DISPOSITION:  PACU - hemodynamically stable.   Delay start of Pharmacological VTE agent (>24hrs) due to surgical blood loss or risk of bleeding: not applicable

## 2023-06-18 NOTE — Progress Notes (Signed)
 Pt ready for LAVH / lso and right salpingectomy  Labs reviewed . All questions answered . Abd marked . Proceed

## 2023-06-18 NOTE — Anesthesia Preprocedure Evaluation (Signed)
 Anesthesia Evaluation  Patient identified by MRN, date of birth, ID band Patient awake    Reviewed: Allergy & Precautions, NPO status , Patient's Chart, lab work & pertinent test results  History of Anesthesia Complications (+) PONV and history of anesthetic complications  Airway Mallampati: II  TM Distance: >3 FB Neck ROM: full    Dental  (+) Dental Advidsory Given, Teeth Intact   Pulmonary neg pulmonary ROS   Pulmonary exam normal        Cardiovascular negative cardio ROS Normal cardiovascular exam     Neuro/Psych negative neurological ROS  negative psych ROS   GI/Hepatic negative GI ROS, Neg liver ROS,,,  Endo/Other  negative endocrine ROS    Renal/GU      Musculoskeletal   Abdominal   Peds  Hematology negative hematology ROS (+)   Anesthesia Other Findings Past Medical History: No date: Allergic rhinitis     Comment:  dustmite, ragweed No date: Breast mass     Comment:  RIGHT BREAST 4 OCLOCK - PT HAD AUGMENTATION 3 MONTHS AGO              AND HAS NOTICED CHANGE BUT NO PAIN 01/2019: COVID-19 virus infection 2001: Endometriosis     Comment:  stage 3 s/p surgery at age 43 yo 3: Fibroadenoma of right breast in female     Comment:  removed No date: Fibrocystic breast No date: GERD (gastroesophageal reflux disease) No date: History of chicken pox No date: Palpitations No date: Patellofemoral pain syndrome of right knee No date: Perimenopause No date: PONV (postoperative nausea and vomiting)  Past Surgical History: 04/10/2015: AUGMENTATION MAMMAPLASTY; Bilateral 06/09/1998: BREAST MASS EXCISION; Right     Comment:  fibroadenoma 06/10/1999: LAPAROSCOPY     Comment:  for endometriosis  BMI    Body Mass Index: 23.49 kg/m      Reproductive/Obstetrics negative OB ROS                             Anesthesia Physical Anesthesia Plan  ASA: 1  Anesthesia Plan: General ETT  and General   Post-op Pain Management:    Induction: Intravenous  PONV Risk Score and Plan: 4 or greater and Ondansetron , Dexamethasone , Midazolam , Propofol  infusion and TIVA  Airway Management Planned: Oral ETT  Additional Equipment:   Intra-op Plan:   Post-operative Plan: Extubation in OR  Informed Consent: I have reviewed the patients History and Physical, chart, labs and discussed the procedure including the risks, benefits and alternatives for the proposed anesthesia with the patient or authorized representative who has indicated his/her understanding and acceptance.     Dental Advisory Given  Plan Discussed with: Anesthesiologist, CRNA and Surgeon  Anesthesia Plan Comments: (Patient consented for risks of anesthesia including but not limited to:  - adverse reactions to medications - damage to eyes, teeth, lips or other oral mucosa - nerve damage due to positioning  - sore throat or hoarseness - Damage to heart, brain, nerves, lungs, other parts of body or loss of life  Patient voiced understanding and assent.)        Anesthesia Quick Evaluation

## 2023-06-18 NOTE — Transfer of Care (Signed)
 Immediate Anesthesia Transfer of Care Note  Patient: Marie Lester  Procedure(s) Performed: LAPAROSCOPIC ASSISTED VAGINAL HYSTERECTOMY WITH  LEFT SALPINGO OOPHORECTOMY, RIGHT SALPINGECTOMY (Left: Abdomen)  Patient Location: PACU  Anesthesia Type:General  Level of Consciousness: drowsy  Airway & Oxygen Therapy: Patient Spontanous Breathing and Patient connected to face mask oxygen  Post-op Assessment: Report given to RN and Post -op Vital signs reviewed and stable  Post vital signs: Reviewed and stable  Last Vitals:  Vitals Value Taken Time  BP 90/61 06/18/23 1046  Temp    Pulse 67 06/18/23 1050  Resp 14 06/18/23 1050  SpO2 100 % 06/18/23 1050  Vitals shown include unfiled device data.  Last Pain:  Vitals:   06/18/23 0637  TempSrc: Oral  PainSc: 0-No pain         Complications: No notable events documented.

## 2023-06-19 ENCOUNTER — Encounter: Payer: Self-pay | Admitting: Obstetrics and Gynecology

## 2023-06-22 LAB — SURGICAL PATHOLOGY

## 2023-06-23 ENCOUNTER — Encounter: Payer: 59 | Admitting: Family Medicine

## 2023-06-23 ENCOUNTER — Encounter: Payer: Self-pay | Admitting: Family Medicine

## 2023-06-23 DIAGNOSIS — Z90710 Acquired absence of both cervix and uterus: Secondary | ICD-10-CM | POA: Insufficient documentation

## 2023-06-29 ENCOUNTER — Encounter: Payer: Self-pay | Admitting: Obstetrics and Gynecology

## 2023-07-22 DIAGNOSIS — N898 Other specified noninflammatory disorders of vagina: Secondary | ICD-10-CM | POA: Diagnosis not present

## 2023-08-10 ENCOUNTER — Other Ambulatory Visit: Payer: 59

## 2023-08-12 ENCOUNTER — Telehealth: Payer: Self-pay

## 2023-08-12 NOTE — Telephone Encounter (Signed)
 Copied from CRM [id number]. Topic: Clinical - Request for Lab/Test Order >> Aug 12, 2023  3:29 PM Marie Lester wrote: Reason for CRM: Patient would like to confirm for her upcoming labs that orders are in to test her iron levels as well.

## 2023-08-12 NOTE — Telephone Encounter (Signed)
 Patient does have labs in. I have called but call can't go through. Will send my chart message now to let patient know.

## 2023-08-18 ENCOUNTER — Encounter: Payer: 59 | Admitting: Family Medicine

## 2023-08-18 DIAGNOSIS — N898 Other specified noninflammatory disorders of vagina: Secondary | ICD-10-CM | POA: Diagnosis not present

## 2023-10-01 ENCOUNTER — Telehealth: Admitting: Physician Assistant

## 2023-10-01 DIAGNOSIS — J069 Acute upper respiratory infection, unspecified: Secondary | ICD-10-CM | POA: Diagnosis not present

## 2023-10-01 MED ORDER — BENZONATATE 100 MG PO CAPS: 100.0000 mg | ORAL_CAPSULE | Freq: Three times a day (TID) | ORAL | 0 refills | Status: AC | PRN

## 2023-10-01 MED ORDER — FLUTICASONE PROPIONATE 50 MCG/ACT NA SUSP: 2.0000 | Freq: Every day | NASAL | 0 refills | Status: AC

## 2023-10-01 MED ORDER — PREDNISONE 10 MG (21) PO TBPK: ORAL_TABLET | ORAL | 0 refills | Status: AC

## 2023-10-01 NOTE — Progress Notes (Signed)

## 2023-10-01 NOTE — Progress Notes (Signed)
 I have spent 5 minutes in review of e-visit questionnaire, review and updating patient chart, medical decision making and response to patient.   Piedad Climes, PA-C

## 2023-10-01 NOTE — Addendum Note (Signed)
 Addended by: Farris Hong on: 10/01/2023 07:13 PM   Modules accepted: Orders

## 2023-10-03 MED ORDER — AZITHROMYCIN 250 MG PO TABS: ORAL_TABLET | ORAL | 0 refills | Status: AC

## 2023-10-03 NOTE — Addendum Note (Signed)
 Addended by: Elner Hahn on: 10/03/2023 08:57 AM   Modules accepted: Orders

## 2023-11-09 ENCOUNTER — Other Ambulatory Visit

## 2023-11-16 ENCOUNTER — Encounter: Admitting: Family Medicine

## 2024-06-17 ENCOUNTER — Encounter
